# Patient Record
Sex: Male | Born: 1970 | Race: Black or African American | Hispanic: No | Marital: Single | Smoking: Current some day smoker
Health system: Southern US, Community
[De-identification: ages and names within clinical notes are randomized; demographics above are authoritative.]

## PROBLEM LIST (undated history)

## (undated) DIAGNOSIS — R55 Syncope and collapse: Secondary | ICD-10-CM

## (undated) HISTORY — PX: OTHER SURGICAL HISTORY: SHX169

---
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
## 2007-02-14 ENCOUNTER — Emergency Department (HOSPITAL_COMMUNITY): Admission: EM | Admit: 2007-02-14 | Discharge: 2007-02-14 | Payer: Self-pay | Admitting: Emergency Medicine

## 2007-12-04 ENCOUNTER — Emergency Department (HOSPITAL_COMMUNITY): Admission: EM | Admit: 2007-12-04 | Discharge: 2007-12-04 | Payer: Self-pay | Admitting: Emergency Medicine

## 2010-01-15 ENCOUNTER — Emergency Department (HOSPITAL_COMMUNITY): Admission: EM | Admit: 2010-01-15 | Discharge: 2010-01-15 | Payer: Self-pay | Admitting: Emergency Medicine

## 2010-03-29 ENCOUNTER — Emergency Department (HOSPITAL_COMMUNITY): Admission: EM | Admit: 2010-03-29 | Discharge: 2010-03-29 | Payer: Self-pay | Admitting: Emergency Medicine

## 2010-05-27 ENCOUNTER — Emergency Department (HOSPITAL_COMMUNITY): Admission: EM | Admit: 2010-05-27 | Discharge: 2010-05-27 | Payer: Self-pay | Admitting: Emergency Medicine

## 2010-06-30 ENCOUNTER — Emergency Department (HOSPITAL_COMMUNITY): Admission: EM | Admit: 2010-06-30 | Discharge: 2010-06-30 | Payer: Self-pay | Admitting: Emergency Medicine

## 2010-07-27 ENCOUNTER — Emergency Department (HOSPITAL_COMMUNITY): Admission: EM | Admit: 2010-07-27 | Discharge: 2010-07-27 | Payer: Self-pay | Admitting: Emergency Medicine

## 2010-12-30 LAB — RAPID URINE DRUG SCREEN, HOSP PERFORMED
Barbiturates: NOT DETECTED
Opiates: NOT DETECTED
Tetrahydrocannabinol: POSITIVE — AB

## 2010-12-30 LAB — POCT I-STAT, CHEM 8
BUN: 7 mg/dL (ref 6–23)
Calcium, Ion: 1.15 mmol/L (ref 1.12–1.32)
Chloride: 101 mEq/L (ref 96–112)

## 2011-03-18 ENCOUNTER — Emergency Department (HOSPITAL_COMMUNITY): Payer: Self-pay

## 2011-03-18 ENCOUNTER — Emergency Department (HOSPITAL_COMMUNITY)
Admission: EM | Admit: 2011-03-18 | Discharge: 2011-03-18 | Disposition: A | Discharge status: Home / Self Care | Payer: Self-pay | Attending: Emergency Medicine | Admitting: Emergency Medicine

## 2011-03-18 DIAGNOSIS — M542 Cervicalgia: Secondary | ICD-10-CM | POA: Insufficient documentation

## 2011-03-18 DIAGNOSIS — M5412 Radiculopathy, cervical region: Secondary | ICD-10-CM | POA: Insufficient documentation

## 2011-03-18 DIAGNOSIS — M25519 Pain in unspecified shoulder: Secondary | ICD-10-CM | POA: Insufficient documentation

## 2011-03-18 DIAGNOSIS — M5030 Other cervical disc degeneration, unspecified cervical region: Secondary | ICD-10-CM | POA: Insufficient documentation

## 2011-05-08 ENCOUNTER — Emergency Department (HOSPITAL_COMMUNITY)
Admission: EM | Admit: 2011-05-08 | Discharge: 2011-05-09 | Disposition: A | Discharge status: Home / Self Care | Payer: Self-pay | Attending: Emergency Medicine | Admitting: Emergency Medicine

## 2011-05-08 DIAGNOSIS — K089 Disorder of teeth and supporting structures, unspecified: Secondary | ICD-10-CM | POA: Insufficient documentation

## 2011-05-08 DIAGNOSIS — K006 Disturbances in tooth eruption: Secondary | ICD-10-CM | POA: Insufficient documentation

## 2012-04-20 ENCOUNTER — Emergency Department (HOSPITAL_COMMUNITY)
Admission: EM | Admit: 2012-04-20 | Discharge: 2012-04-20 | Disposition: A | Discharge status: Home / Self Care | Payer: Worker's Compensation | Attending: Emergency Medicine | Admitting: Emergency Medicine

## 2012-04-20 ENCOUNTER — Encounter (HOSPITAL_COMMUNITY): Payer: Self-pay | Admitting: *Deleted

## 2012-04-20 DIAGNOSIS — W868XXA Exposure to other electric current, initial encounter: Secondary | ICD-10-CM | POA: Insufficient documentation

## 2012-04-20 DIAGNOSIS — T754XXA Electrocution, initial encounter: Secondary | ICD-10-CM

## 2012-04-20 DIAGNOSIS — R209 Unspecified disturbances of skin sensation: Secondary | ICD-10-CM | POA: Insufficient documentation

## 2012-04-20 LAB — CBC WITH DIFFERENTIAL/PLATELET
Basophils Absolute: 0 10*3/uL (ref 0.0–0.1)
Eosinophils Relative: 3 % (ref 0–5)
HCT: 41.1 % (ref 39.0–52.0)
Lymphocytes Relative: 50 % — ABNORMAL HIGH (ref 12–46)
Lymphs Abs: 3.3 10*3/uL (ref 0.7–4.0)
MCV: 96.5 fL (ref 78.0–100.0)
Monocytes Absolute: 0.3 10*3/uL (ref 0.1–1.0)
Monocytes Relative: 5 % (ref 3–12)
RDW: 13.3 % (ref 11.5–15.5)
WBC: 6.6 10*3/uL (ref 4.0–10.5)

## 2012-04-20 LAB — CK TOTAL AND CKMB (NOT AT ARMC)
CK, MB: 1.6 ng/mL (ref 0.3–4.0)
Relative Index: 1 (ref 0.0–2.5)
Total CK: 155 U/L (ref 7–232)

## 2012-04-20 LAB — COMPREHENSIVE METABOLIC PANEL
BUN: 14 mg/dL (ref 6–23)
CO2: 21 mEq/L (ref 19–32)
Calcium: 8.9 mg/dL (ref 8.4–10.5)
Creatinine, Ser: 1.14 mg/dL (ref 0.50–1.35)
GFR calc Af Amer: >90 mL/min (ref >90)
GFR calc non Af Amer: 80 mL/min — ABNORMAL LOW (ref >90)
Glucose, Bld: 128 mg/dL — ABNORMAL HIGH (ref 70–99)

## 2012-04-20 NOTE — ED Notes (Signed)
The pt was shocked  Tonight at work when he was shocked when he touched a wire.  He has pain and burning in his rt arm where he touched the hot wire

## 2012-04-20 NOTE — ED Provider Notes (Signed)
History     CSN: [id number]  Arrival date & time 04/20/12  0123   First MD Initiated Contact with Patient 04/20/12 [phone]      Chief Complaint  Patient presents with  . shocked at work     HPI  History provided by the patient. Patient is a 41 year old male with no significant past medical history who presents with complaints of lateral shock right hand. Patient was at work at [employer] and states that he was sliding out a small mini freezer and reached behind the unit in the felt a shock to his right hand traveling up his forearm and into the shoulder area. Shock was brief and patient immediately withdrew his hand. Freezer was plugged into a standard outlet. Patient denies any burns or lesions to the hand and skin. Injury occurred several hours ago. Patient now reports having some return of sensations in hand and arm. He has normal strength and movement. He denied having any heart palpitations or other chest pain symptoms following the injury.    History reviewed. No pertinent past medical history.  History reviewed. No pertinent past surgical history.  No family history on file.  History  Substance Use Topics  . Smoking status: Current Everyday Smoker  . Smokeless tobacco: Not on file  . Alcohol Use: No      Review of Systems  Respiratory: Negative for shortness of breath.   Cardiovascular: Negative for chest pain and palpitations.  Musculoskeletal: Negative for joint swelling.  Skin:       Negative for burns  Neurological: Negative for dizziness, light-headedness and headaches.    Allergies  Review of patient's allergies indicates no known allergies.  Home Medications  No current outpatient prescriptions on file.  BP 137/75  Pulse 66  Temp 98.4 F (36.9 C) (Oral)  Resp 18  SpO2 99%  Physical Exam  Nursing note and vitals reviewed. Constitutional: He is oriented to person, place, and time. He appears well-developed and well-nourished.  HENT:  Head:  Normocephalic.  Cardiovascular: Normal rate and regular rhythm.   Pulmonary/Chest: Effort normal and breath sounds normal.  Musculoskeletal:       Normal sensation in right fingers, normal grip strength, normal cap refill less than 2 seconds full range of motion. Skin appears normal  Neurological: He is alert and oriented to person, place, and time.  Skin: Skin is warm.  Psychiatric: He has a normal mood and affect. His behavior is normal.    ED Course  Procedures   Results for orders placed during the hospital encounter of 04/20/12  CBC WITH DIFFERENTIAL      Component Value Range   WBC 6.6  4.0 - 10.5 K/uL   RBC 4.26  4.22 - 5.81 MIL/uL   Hemoglobin 14.3  13.0 - 17.0 g/dL   HCT 11.9  14.7 - 82.9 %   MCV 96.5  78.0 - 100.0 fL   MCH 33.6  26.0 - 34.0 pg   MCHC 34.8  30.0 - 36.0 g/dL   RDW 56.2  13.0 - 86.5 %   Platelets 140 (*) 150 - 400 K/uL   Neutrophils Relative 41 (*) 43 - 77 %   Neutro Abs 2.7  1.7 - 7.7 K/uL   Lymphocytes Relative 50 (*) 12 - 46 %   Lymphs Abs 3.3  0.7 - 4.0 K/uL   Monocytes Relative 5  3 - 12 %   Monocytes Absolute 0.3  0.1 - 1.0 K/uL   Eosinophils Relative 3  0 - 5 %   Eosinophils Absolute 0.2  0.0 - 0.7 K/uL   Basophils Relative 1  0 - 1 %   Basophils Absolute 0.0  0.0 - 0.1 K/uL  COMPREHENSIVE METABOLIC PANEL      Component Value Range   Sodium 135  135 - 145 mEq/L   Potassium 3.5  3.5 - 5.1 mEq/L   Chloride 101  96 - 112 mEq/L   CO2 21  19 - 32 mEq/L   Glucose, Bld 128 (*) 70 - 99 mg/dL   BUN 14  6 - 23 mg/dL   Creatinine, Ser 7.82  0.50 - 1.35 mg/dL   Calcium 8.9  8.4 - 95.6 mg/dL   Total Protein 7.0  6.0 - 8.3 g/dL   Albumin 3.9  3.5 - 5.2 g/dL   AST 16  0 - 37 U/L   ALT 7  0 - 53 U/L   Alkaline Phosphatase 35 (*) 39 - 117 U/L   Total Bilirubin 0.3  0.3 - 1.2 mg/dL   GFR calc non Af Amer 80 (*) >90 mL/min   GFR calc Af Amer >90  >90 mL/min  CK TOTAL AND CKMB      Component Value Range   Total CK 155  7 - 232 U/L   CK, MB 1.6  0.3 -  4.0 ng/mL   Relative Index 1.0  0.0 - 2.5  TROPONIN I      Component Value Range   Troponin I <0.30  <0.30 ng/mL       1. Electrical shock of hand       MDM  4:50 AM patient seen and evaluated. Patient no acute distress. Patient with unremarkable EKG and lab tests. No external signs for burns or other injury. Patient feels arm and are returning to normal.      Date: 04/20/2012  Rate: 67  Rhythm: normal sinus rhythm  QRS Axis: normal  Intervals: normal  ST/T Wave abnormalities: normal  Conduction Disutrbances:none  Narrative Interpretation:   Old EKG Reviewed: none available          Angus Seller, [place] 04/20/12 [phone]

## 2012-04-20 NOTE — ED Provider Notes (Signed)
Medical screening examination/treatment/procedure(s) were performed by non-physician practitioner and as supervising physician I was immediately available for consultation/collaboration.   Gwyneth Sprout, MD 04/20/12 [phone]

## 2012-12-21 ENCOUNTER — Emergency Department (HOSPITAL_COMMUNITY)
Admission: EM | Admit: 2012-12-21 | Discharge: 2012-12-21 | Disposition: A | Discharge status: Home / Self Care | Payer: Self-pay | Attending: Emergency Medicine | Admitting: Emergency Medicine

## 2012-12-21 ENCOUNTER — Encounter (HOSPITAL_COMMUNITY): Payer: Self-pay | Admitting: Adult Health

## 2012-12-21 DIAGNOSIS — F17200 Nicotine dependence, unspecified, uncomplicated: Secondary | ICD-10-CM | POA: Insufficient documentation

## 2012-12-21 DIAGNOSIS — K089 Disorder of teeth and supporting structures, unspecified: Secondary | ICD-10-CM | POA: Insufficient documentation

## 2012-12-21 DIAGNOSIS — K0889 Other specified disorders of teeth and supporting structures: Secondary | ICD-10-CM

## 2012-12-21 MED ORDER — ACETAMINOPHEN-CODEINE #3 300-30 MG PO TABS: 1.0000 | ORAL_TABLET | Freq: Four times a day (QID) | ORAL | Status: DC | PRN

## 2012-12-21 NOTE — ED Provider Notes (Signed)
History    This chart was scribed for non-physician practitioner working with [name], MD by Donne Anon, ED Scribe. This patient was seen in room TR09C/TR09C.   CSN: [id number]  Arrival date & time 12/21/12  2216   None     Chief Complaint  Patient presents with  . Dental Pain     The history is provided by the patient. No language interpreter was used.   Justin Rangel is a 42 y.o. male who presents to the Emergency Department complaining of gradual onset, constant, gradually worsening right sided tooth pain described as sharp and stabbing that radiates to his eyes and head and began yesterday while he was working on his car.He reports that the pain is worse when it is exposed to cold temperatures. He denies fever, chills or any other pain. He is scheduled to see a dentist next week. He has tried Advil with little relief.  Pt is a current everyday smoker but denies alcohol use.  History reviewed. No pertinent past medical history.  History reviewed. No pertinent past surgical history.  History reviewed. No pertinent family history.  History  Substance Use Topics  . Smoking status: Current Every Day Smoker  . Smokeless tobacco: Not on file  . Alcohol Use: No      Review of Systems  HENT: Positive for dental problem.   All other systems reviewed and are negative.    Allergies  Review of patient's allergies indicates no known allergies.  Home Medications  No current outpatient prescriptions on file.  Triage Vitals; BP 130/92  Pulse 82  Temp(Src) 97.6 F (36.4 C) (Oral)  Resp 20  SpO2 97%  Physical Exam  Nursing note and vitals reviewed. Constitutional: He is oriented to person, place, and time. He appears well-developed and well-nourished. No distress.  HENT:  Head: Normocephalic and atraumatic.  Dental carries. Poor dentition. Tenderness on right lower 2nd molar. No surrounding erythema, edema or abscess. No facial swelling or tenderness to  palpation.  Eyes: Conjunctivae and EOM are normal.  Neck: Normal range of motion. Neck supple.  Cardiovascular: Normal rate, regular rhythm and normal heart sounds.   Pulmonary/Chest: Effort normal and breath sounds normal.  Abdominal: Soft. Bowel sounds are normal. He exhibits no distension.  Musculoskeletal: Normal range of motion. He exhibits no edema.  Neurological: He is alert and oriented to person, place, and time.  Skin: Skin is warm and dry.  Psychiatric: He has a normal mood and affect. His behavior is normal.    ED Course  Procedures (including critical care time) DIAGNOSTIC STUDIES: Oxygen Saturation is 97% on room air, adequate by my interpretation.    COORDINATION OF CARE: 10:27 PM Discussed treatment plan which includes pain medication with pt at bedside and pt agreed to plan.       Labs Reviewed - No data to display No results found.   1. Pain, dental       MDM   Dental pain without associated with dental infection. No evidence of dental abscess. Patient is afebrile, non toxic appearing and swallowing secretions well. He has an appointment with the dentist "across the street" next week. Rx tylenol #3. Return precautions discussed. Patient states understanding of plan and is agreeable.     I personally performed the services described in this documentation, which was scribed in my presence. The recorded information has been reviewed and is accurate.        Trevor Mace, PA-C 12/21/12 2250

## 2012-12-21 NOTE — ED Notes (Signed)
Presents with right sided tooth pain that radiates to eyes and head. Pt requesting doctors note for work and something for tooth pain.  Pt staes, "I am supposed to go see a dentist about this tooth and pain but I am going to do that after I come back from [employer]" He staes, "the tooth pain is making my head hurt" alert and oriented.

## 2012-12-22 NOTE — ED Provider Notes (Signed)
Medical screening examination/treatment/procedure(s) were performed by non-physician practitioner and as supervising physician I was immediately available for consultation/collaboration.   [name], MD 12/22/12 [phone]

## 2013-01-24 DIAGNOSIS — R55 Syncope and collapse: Secondary | ICD-10-CM

## 2013-01-24 HISTORY — DX: Syncope and collapse: R55

## 2013-01-25 ENCOUNTER — Emergency Department (HOSPITAL_COMMUNITY): Payer: Self-pay

## 2013-01-25 ENCOUNTER — Encounter (HOSPITAL_COMMUNITY): Payer: Self-pay | Admitting: Emergency Medicine

## 2013-01-25 ENCOUNTER — Observation Stay (HOSPITAL_COMMUNITY)
Admission: EM | Admit: 2013-01-25 | Discharge: 2013-01-27 | Disposition: A | Discharge status: Home / Self Care | Payer: 59 | Attending: Internal Medicine | Admitting: Internal Medicine

## 2013-01-25 DIAGNOSIS — R55 Syncope and collapse: Principal | ICD-10-CM

## 2013-01-25 DIAGNOSIS — W19XXXA Unspecified fall, initial encounter: Secondary | ICD-10-CM | POA: Insufficient documentation

## 2013-01-25 DIAGNOSIS — M25519 Pain in unspecified shoulder: Secondary | ICD-10-CM | POA: Insufficient documentation

## 2013-01-25 DIAGNOSIS — Z8241 Family history of sudden cardiac death: Secondary | ICD-10-CM | POA: Insufficient documentation

## 2013-01-25 DIAGNOSIS — R9431 Abnormal electrocardiogram [ECG] [EKG]: Secondary | ICD-10-CM

## 2013-01-25 HISTORY — DX: Syncope and collapse: R55

## 2013-01-25 LAB — LIPID PANEL
HDL: 48 mg/dL (ref >39)
LDL Cholesterol: 108 mg/dL — ABNORMAL HIGH (ref 0–99)
VLDL: 17 mg/dL (ref 0–40)

## 2013-01-25 LAB — RAPID URINE DRUG SCREEN, HOSP PERFORMED
Barbiturates: NOT DETECTED
Benzodiazepines: NOT DETECTED
Cocaine: NOT DETECTED
Tetrahydrocannabinol: POSITIVE — AB

## 2013-01-25 LAB — BASIC METABOLIC PANEL
BUN: 12 mg/dL (ref 6–23)
Creatinine, Ser: 1.12 mg/dL (ref 0.50–1.35)
GFR calc Af Amer: >90 mL/min (ref >90)
GFR calc non Af Amer: 81 mL/min — ABNORMAL LOW (ref >90)
Glucose, Bld: 81 mg/dL (ref 70–99)

## 2013-01-25 LAB — CBC WITH DIFFERENTIAL/PLATELET
Basophils Absolute: 0 10*3/uL (ref 0.0–0.1)
Basophils Relative: 0 % (ref 0–1)
Eosinophils Absolute: 0.1 10*3/uL (ref 0.0–0.7)
MCH: 33.8 pg (ref 26.0–34.0)
MCHC: 35.9 g/dL (ref 30.0–36.0)
Monocytes Absolute: 0.5 10*3/uL (ref 0.1–1.0)
Neutro Abs: 4.3 10*3/uL (ref 1.7–7.7)
Neutrophils Relative %: 54 % (ref 43–77)
RDW: 13.3 % (ref 11.5–15.5)

## 2013-01-25 LAB — TROPONIN I: Troponin I: <0.3 ng/mL (ref <0.30)

## 2013-01-25 LAB — HEMOGLOBIN A1C
Hgb A1c MFr Bld: 5.5 % (ref <5.7)
Mean Plasma Glucose: 111 mg/dL (ref <117)

## 2013-01-25 LAB — HIV ANTIBODY (ROUTINE TESTING W REFLEX): HIV: NONREACTIVE

## 2013-01-25 MED ORDER — SODIUM CHLORIDE 0.9 % IJ SOLN: 3.0000 mL | Freq: Two times a day (BID) | INTRAMUSCULAR | Status: DC

## 2013-01-25 MED ORDER — ENOXAPARIN SODIUM 40 MG/0.4ML ~~LOC~~ SOLN
40.0000 mg | SUBCUTANEOUS | Status: DC
  Administered 2013-01-25 – 2013-01-27 (×3): 40 mg via SUBCUTANEOUS
  Filled 2013-01-25 (×3): qty 0.4

## 2013-01-25 MED ORDER — SODIUM CHLORIDE 0.9 % IV BOLUS (SEPSIS)
1000.0000 mL | Freq: Once | INTRAVENOUS | Status: AC
  Administered 2013-01-25: 1000 mL via INTRAVENOUS

## 2013-01-25 MED ORDER — SODIUM CHLORIDE 0.9 % IJ SOLN
3.0000 mL | Freq: Two times a day (BID) | INTRAMUSCULAR | Status: DC
  Administered 2013-01-25 – 2013-01-27 (×4): 3 mL via INTRAVENOUS

## 2013-01-25 NOTE — H&P (Signed)
Hospital Admission Note Date: 01/25/2013  Patient name: Justin Rangel Medical record number: [id number] Date of birth: 06/08/1971 Age: 42 y.o. Gender: male PCP: Default, Provider, MD  Medical Service: Internal Medicine Teaching Service  Attending physician:  Blanch Media, MD    1st Contact: Denton Ar, MD  Pager: [phone] 2nd Contact: Genella Mech, MD  Pager: [phone] After 5 pm or weekends: 1st Contact:      Pager: [phone] 2nd Contact:      Pager: [phone]  Chief Complaint:  Syncope   History of Present Illness: This is a 42 year old male with no past medical history who presented to the ED her car for one episode of syncope. Patient reports he was arguing with his cold friend last night and the next thing he knows that he is dropped off at the hospital. Prior to the syncope episode he noted some dizziness but denies any chest pain, shortness breath, headache, weakness, jerking movements, vision changes, nausea, vomiting. He denies any use of alcohol or drugs prior to the event. He does not remember what happened between the timing of passing out and arriving at the hospital. During evaluation of patient no one was available to confirm the story. Per ED note when patient presented cough on and a cousin was present who informed that ED physician about is syncopal episode. Denied any jerking movement. Patient reports this has been the first episode ever. He denies any head injury in the past. Patient noted that he traveled on a bus last week to [place]. During the knee he would walk around as much as possible. Denies any leg pain or swelling. Patient mentioned that he had in upper respirations sickness likely 1-2 weeks ago which resolved with without any intervention.  Patient informed me that his cousin age 21 passed away on 02-22-2023 with sudden cardiac arrest while walking in the mall. No other family members are known to have any similar history. He does not perform any sport  activities now or previously in school. He currently works at [employer].  Of note: Patient was evaluated in April 2000 (4 one episode of syncope. He was getting on a bus and passed out. CT head was negative for any acute process. UDS was positive for marijuana. Patient was discharged home. Meds: No current outpatient prescriptions on file.  Allergies: Allergies as of 01/25/2013  . (No Known Allergies)   History reviewed. No pertinent past medical history. History reviewed. No pertinent past surgical history. Family History  Problem Relation Age of Onset  . Hypertension Mother   . Heart attack Cousin 39    Maternal. Sudden cardiac arrest.    History   Social History  . Marital Status: Single    Spouse Name: N/A    Number of Children: N/A  . Years of Education: N/A   Occupational History  . Not on file.   Social History Main Topics  . Smoking status: Current Every Day Smoker -- 1.00 packs/day for 10 years    Types: Cigarettes  . Smokeless tobacco: Not on file  . Alcohol Use: Yes     Comment: Occasioanl   . Drug Use: No  . Sexually Active: Not on file   Other Topics Concern  . Not on file   Social History Narrative   Works at [employer]. Completed 11th grade.    Review of Systems:  Constitutional: Denies fever, chills, diaphoresis, appetite change and fatigue.  HEENT: Denies photophobia,  hearing loss, ear pain, trouble swallowing, neck pain  Respiratory: Denies SOB, DOE, cough, chest tightness,  and wheezing.   Cardiovascular: Denies chest pain, palpitations and leg swelling.  Gastrointestinal: Denies nausea, vomiting, abdominal pain, diarrhea, constipation, blood in stool and abdominal distention.  Genitourinary: Denies dysuria, urgency, frequency, hematuria, flank pain and difficulty urinating.  Musculoskeletal: Denies myalgias, Skin: Denies pallor, rash and wound.  Neurological: Denies dizziness, seizures, weakness, light-headedness, numbness but mild  headaches.  Hematological: Denies adenopathy  Physical Exam: Blood pressure 126/84, pulse 58, temperature 98.5 F (36.9 C), temperature source Oral, resp. rate 16, SpO2 97.00%. Constitutional: Vital signs reviewed.  Patient is a well-developed and well-nourished  in no acute distress and cooperative with exam. Alert and oriented x3.  Head: Normocephalic and atraumatic Ear: TM normal bilaterally Mouth: no erythema or exudates, MMM, poor dentition  Eyes: PERRL, EOMI, conjunctivae normal, No scleral icterus.  Neck: Supple, Trachea midline normal ROM,no  carotid bruit present.  Cardiovascular: RRR, S1 normal, S2 normal, no MRG, pulses symmetric and intact bilaterally Pulmonary/Chest: CTAB, no wheezes, rales, or rhonchi Abdominal: Soft. Non-tender, non-distended, bowel sounds are normal, no masses, organomegaly, or guarding present.  GU: no CVA tenderness Musculoskeletal: No joint deformities, erythema, or stiffness, ROM full and no nontender Hematology: no cervical,  adenopathy.  Neurological: A&O x3, Strenght is normal and symmetric bilaterally, cranial nerve II-XII are grossly intact, no focal motor deficit, sensory intact to light touch bilaterally.  Skin: Warm, dry and intact. No rash, cyanosis, or clubbing.  Psychiatric: Normal mood and affect.  Lab results: Basic Metabolic Panel:  Recent Labs  78/29/56 0354  NA 138  K 4.4  CL 102  CO2 27  GLUCOSE 81  BUN 12  CREATININE 1.12  CALCIUM 9.3    CBC:  Recent Labs  01/25/13 0354  WBC 7.9  NEUTROABS 4.3  HGB 16.2  HCT 45.1  MCV 94.2  PLT 170   Cardiac Enzymes:  Recent Labs  01/25/13 0354  TROPONINI <0.30   Urine Drug Screen: Drugs of Abuse     Component Value Date/Time   LABOPIA NONE DETECTED 01/25/2013 0528   COCAINSCRNUR NONE DETECTED 01/25/2013 0528   LABBENZ NONE DETECTED 01/25/2013 0528   AMPHETMU NONE DETECTED 01/25/2013 0528   THCU POSITIVE* 01/25/2013 0528   LABBARB NONE DETECTED 01/25/2013 0528       Imaging results:  Dg Shoulder Left  01/25/2013  *RADIOLOGY REPORT*  Clinical Data: Status post fall; left shoulder pain.  LEFT SHOULDER - 2+ VIEW  Comparison: None.  Findings: There is no evidence of fracture or dislocation.  The left humeral head is seated within the glenoid fossa.  The acromioclavicular joint is unremarkable in appearance.  No significant soft tissue abnormalities are seen.  The visualized portions of the left lung are clear.  IMPRESSION: No evidence of fracture or dislocation.   Original Report Authenticated By: Tonia Ghent, M.D.     Other results: EKG:  01/25/13 at 4:06 am: Sinus rhythm Rate of 55, QTc 412 Elevation in leads II, V3, V4, V6  01/25/13 at 6:05 am : Sinus Rhythm, Rate of 70, QTc 414 , ST elevation V2, V3,V4, V5   Assessment & Plan by Problem: Active Problems:   Syncope and collapse  # Syncope an collapse- unclear etiology. Differential diagnosis include vasovagal, cardiogenic including arrhythmias, hypertrophic cardiomyopathy ( Cousin with sudden cardiac arrest), pericarditis/myocarditis ( ST elevation in multiple leads but not diffuse and no chest pain or friction rub present), PE  ( Modified geneva score 0)recreational drugs ( UDS positive for marijuana) seizure (  per ED note no history of jerking movements),  orthostatic hypotension. - Will admit to tele monitor - Will cycle cardiac enzymes -Will obtain 2-D echo to evaluate for any structural abnormalities  - Will evaluate for any risk factors including hemoglobin A1c, lipid panel and HIV -Will obtain orthostatic blood pressure - Will obtain D-dimer - Consider to consult Cardiology   # Tobacco abuse: Constant in smoking cessation  Dispo: Disposition is deferred at this time, awaiting improvement of current medical problems.   The patient does not have a current PCP (Default, Provider, MD), therefore will be requiring OPC follow-up after discharge.   The patient does not have transportation  limitations that hinder transportation to clinic appointments.  SignedAlmyra Deforest 01/25/2013, 8:48 AM

## 2013-01-25 NOTE — ED Notes (Signed)
PT. REPORTS " PASSED OUT" AND FELL AFTER A VERBAL ARGUMENT WITH GIRLFRIEND THIS EVENING , ALERT AND ORIENTED /AMBULATORY, REPORTS LEFT SHOULDER PAIN .
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
## 2013-01-25 NOTE — ED Notes (Signed)
Pt denies pain.  No distress noted.  Resp symmetrical and unlabored.  Skin warm and dry.

## 2013-01-25 NOTE — ED Notes (Signed)
Patient transported to X-ray 
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
## 2013-01-26 ENCOUNTER — Encounter (HOSPITAL_COMMUNITY)
Admission: EM | Disposition: A | Discharge status: Home / Self Care | Payer: Self-pay | Source: Home / Self Care | Attending: Emergency Medicine

## 2013-01-26 HISTORY — PX: TILT TABLE STUDY: SHX5493

## 2013-01-26 SURGERY — TILT TABLE STUDY

## 2013-01-26 MED ORDER — EPINEPHRINE HCL 1 MG/ML IJ SOLN
0.0500 ug/min | INTRAVENOUS | Status: DC
  Administered 2013-01-26: 0.05 ug/min via INTRAVENOUS
  Filled 2013-01-26 (×2): qty 1

## 2013-01-26 MED ORDER — FLECAINIDE ACETATE 50 MG PO TABS
150.0000 mg | ORAL_TABLET | Freq: Once | ORAL | Status: AC
  Administered 2013-01-26: 150 mg via ORAL
  Filled 2013-01-26: qty 1
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
## 2013-01-26 NOTE — Op Note (Addendum)
EP procedure Note   Pre procedure Diagnosis:  Unexplained syncope Post procedure Diagnosis:  Same  Procedures:  Epinephrine infusion  Description:  Informed, written consent was obtained for cardioversion.  Adequate IV acces and airway support were assured.  The patient required no sedation for the procedure today.  The patient presented today in sinus rhythm with baselin Qt of 400 msec.  Epinephrine was infused in 5 minute intervals per standard protochol with continuous ekg monitoring. Results as follows.  Baseline:   QT 400 msec, Qtc 386 msec Epi 0.05 mcg/kg/min x 5 minutes:  Qt , Qtc 373 msec Epi 0.10 mcg/kg/min x 5 minutes:  Qt 376 msec, Qtc 378 msec Epi 0.20 mcg/kg/min x 5 minutes: Qt 332 msec, Qtc 336 msec with prominent u waves observed EKG 5 minutes post infusion: Qt 400 msec, Qtc 386 msec  No arrhythmias during infusion though the patient did develop mild chest tightness which resolved.  There were no early apparent complications.  Procedure time 30 minutes.  Conclusions:  1.  Normal epinephrine challenge 2.  No early apparent complications.   Romel Dumond,MD 5:55 PM 01/26/2013

## 2013-01-27 ENCOUNTER — Other Ambulatory Visit: Payer: Self-pay | Admitting: *Deleted

## 2013-01-27 DIAGNOSIS — R55 Syncope and collapse: Secondary | ICD-10-CM

## 2013-01-27 NOTE — Progress Notes (Signed)
EKGs obtained per orders after administration of flecainide.  Justin Simpson PA notified of ST changes after first two EKGs.  Pt. Denies CP, SOB, or palpitations.  VSS.  Will continue to monitor patient.

## 2013-01-27 NOTE — Progress Notes (Signed)
PROGRESS NOTE  Subjective:   Justin Rangel is a 42 yo with hx of recurrent syncope.   He had a negative epinephrine challenge and negative Flecainide challenge yesterday.  He is scheduled ( ?) for treadmill this am.   No further episodes of syncope.  Objective:    Vital Signs:   Temp:  [97.7 F (36.5 C)-98.2 F (36.8 C)] 97.7 F (36.5 C) (04/19 0600) Pulse Rate:  [51-57] 52 (04/19 0600) Resp:  [18] 18 (04/19 0600) BP: (108-124)/(72-80) 108/72 mmHg (04/19 0600) SpO2:  [98 %-99 %] 98 % (04/19 0600)  Last BM Date: 01/26/13   24-hour weight change: Weight change:   Weight trends: Filed Weights   01/25/13 1014 01/26/13 0406  Weight: 150 lb (68.04 kg) 139 lb 11.2 oz (63.368 kg)    Intake/Output:  04/18 0701 - 04/19 0700 In: 720 [P.O.:720] Out: 3 [Urine:3]     Physical Exam: BP 108/72  Pulse 52  Temp(Src) 97.7 F (36.5 C) (Oral)  Resp 18  Ht 6\' 1"  (1.854 m)  Wt 139 lb 11.2 oz (63.368 kg)  BMI 18.44 kg/m2  SpO2 98%  General: Vital signs reviewed and noted. Thin, young black male  Head: Normocephalic, atraumatic.  Eyes: conjunctivae/corneas clear.  EOM's intact.   Throat: normal  Neck:  normal  Lungs:    clear  Heart:  RR , normal S1, S2  Abdomen:  Soft, non-tender, non-distended  thin  Extremities: No edema   Neurologic: A&O X3, CN II - XII are grossly intact.   Psych: Normal     Labs: BMET:  Recent Labs  01/25/13 0354  NA 138  K 4.4  CL 102  CO2 27  GLUCOSE 81  BUN 12  CREATININE 1.12  CALCIUM 9.3    Liver function tests: No results found for this basename: AST, ALT, ALKPHOS, BILITOT, PROT, ALBUMIN,  in the last 72 hours No results found for this basename: LIPASE, AMYLASE,  in the last 72 hours  CBC:  Recent Labs  01/25/13 0354  WBC 7.9  NEUTROABS 4.3  HGB 16.2  HCT 45.1  MCV 94.2  PLT 170    Cardiac Enzymes:  Recent Labs  01/25/13 0354 01/25/13 0831 01/25/13 1250 01/25/13 2112  TROPONINI <0.30 <0.30 <0.30 <0.30    Coagulation  Studies: No results found for this basename: LABPROT, INR,  in the last 72 hours  Other: No components found with this basename: POCBNP,   Recent Labs  01/25/13 1245  DDIMER <0.27    Recent Labs  01/25/13 0828  HGBA1C 5.5    Recent Labs  01/25/13 0834  CHOL 173  HDL 48  LDLCALC 108*  TRIG 87  CHOLHDL 3.6    Recent Labs  01/25/13 0828  TSH 0.750   No results found for this basename: VITAMINB12, FOLATE, FERRITIN, TIBC, IRON, RETICCTPCT,  in the last 72 hours   Other results:  Tele:  NSR    Medications:    Infusions:    Scheduled Medications: . enoxaparin (LOVENOX) injection  40 mg Subcutaneous Q24H  . sodium chloride  3 mL Intravenous Q12H  . sodium chloride  3 mL Intravenous Q12H    Assessment/ Plan:   Active Problems:   Syncope and collapse  See Dr. Jenel Lucks consult note.  The epinephrine challenge and flecainide challenge were reportedly normal.      Disposition: DC today if GXT is normal.  He needs to call our office and ask for Heather or  [employer].  [phone].  He will need to be scheduled for Implantable Loop recorder by Dr. Graciela Husbands next week.   Length of Stay: 2  Vesta Mixer, Montez Hageman., MD, [facility] 01/27/2013, 9:19 AM Office [phone] Pager [phone]

## 2013-01-27 NOTE — Progress Notes (Signed)
EKGs reviewed post flecainide 150mg x 1.  He did not have brugada ekg pattern develop with flecainide administration.  This is a negative study. 

## 2013-01-27 NOTE — Progress Notes (Signed)
Exercise Treadmill Test (see report for further details)  Pre: no chest pain or sob.  VSS.  ECG with diffuse ST elevation.  During: Ex time - 11:54, Max HR 150 (target 153), ECG w/o acute st/t changes, VSS.  He developed lightheadedness without arrhythmia or hypotension and thus test was ended.  Post:  Lightheadedness resolved.  VSS.  ECG w/o acute st/t changes.  Rec:  D/c home today.  No driving until directed otherwise.  He should return Monday 4/21 @ 10 AM to Short Stay for implantable loop recorder with Dr. Graciela Husbands.

## 2013-01-29 ENCOUNTER — Encounter (HOSPITAL_COMMUNITY)
Admission: RE | Disposition: A | Discharge status: Home / Self Care | Payer: Self-pay | Source: Ambulatory Visit | Attending: Internal Medicine

## 2013-01-29 ENCOUNTER — Ambulatory Visit (HOSPITAL_COMMUNITY)
Admission: RE | Admit: 2013-01-29 | Discharge: 2013-01-29 | Disposition: A | Discharge status: Home / Self Care | Payer: Self-pay | Source: Ambulatory Visit | Attending: Internal Medicine | Admitting: Internal Medicine

## 2013-01-29 ENCOUNTER — Encounter: Payer: Self-pay | Admitting: Emergency Medicine

## 2013-01-29 DIAGNOSIS — R55 Syncope and collapse: Secondary | ICD-10-CM

## 2013-01-29 HISTORY — PX: LOOP RECORDER IMPLANT: SHX5477

## 2013-01-29 LAB — SURGICAL PCR SCREEN: Staphylococcus aureus: NEGATIVE

## 2013-01-29 SURGERY — LOOP RECORDER IMPLANT
Anesthesia: LOCAL

## 2013-01-29 MED ORDER — SODIUM CHLORIDE 0.9 % IR SOLN: 80.0000 mg | Status: DC

## 2013-01-29 MED ORDER — MUPIROCIN 2 % EX OINT
TOPICAL_OINTMENT | CUTANEOUS | Status: AC
  Administered 2013-01-29: 1 via NASAL
  Filled 2013-01-29: qty 22

## 2013-01-29 MED ORDER — LIDOCAINE HCL (PF) 1 % IJ SOLN
INTRAMUSCULAR | Status: AC
  Filled 2013-01-29: qty 30

## 2013-01-29 MED ORDER — MUPIROCIN 2 % EX OINT: TOPICAL_OINTMENT | Freq: Once | CUTANEOUS | Status: AC

## 2013-01-29 MED ORDER — CEFAZOLIN SODIUM-DEXTROSE 2-3 GM-% IV SOLR: 2.0000 g | INTRAVENOUS | Status: DC

## 2013-01-29 MED ORDER — CHLORHEXIDINE GLUCONATE 4 % EX LIQD: 60.0000 mL | Freq: Once | CUTANEOUS | Status: DC

## 2013-01-29 NOTE — CV Procedure (Signed)
Preop Dx: syncope Postop Dx: same/     LOOP RECORDER IMPLANT   After routine prep and drape of the left upper chest, lidocaine was infiltrated lateral to the sternum and caudal to the medial aspect of the clavicle. An incision was madewith insertion a Reveal LINQ Medtronic loop recorder serial number [id number].

## 2013-01-29 NOTE — Progress Notes (Signed)
CALLED CAROL,RN IN CATH LAB AND NOTIFIED HER CLIENT WITH STERISTRIPS AND TEGADERM OVER STERISTRIPS AND SHE NOTIFIED DR Graciela Husbands AND CAME UP TO CHANGE DRESSING PER DR Graciela Husbands AND WHEN WE WENT IN CLIENT'S ROOM; HE WAS GONE.

## 2013-01-29 NOTE — Interval H&P Note (Signed)
History and Physical Interval Note:  01/29/2013 12:56 PM  Justin Rangel  has presented today for surgery, with the diagnosis of syncope  The various methods of treatment have been discussed with the patient and family. After consideration of risks, benefits and other options for treatment, the patient has consented to  Procedure(s): LOOP RECORDER IMPLANT (N/A) as a surgical intervention .  The patient's history has been reviewed, patient examined, no change in status, stable for surgery.  I have reviewed the patient's chart and labs.  Questions were answered to the patient's satisfaction.     Sherryl Manges

## 2013-01-29 NOTE — H&P (View-Only) (Signed)
EKGs reviewed post flecainide 150mg  x 1.  He did not have brugada ekg pattern develop with flecainide administration.  This is a negative study.

## 2013-01-31 ENCOUNTER — Telehealth: Payer: Self-pay | Admitting: Internal Medicine

## 2013-01-31 ENCOUNTER — Telehealth: Payer: Self-pay | Admitting: *Deleted

## 2013-01-31 NOTE — Telephone Encounter (Signed)
Walk In Pt Form " When can Pt RTW" Sent to Message Nurse  01/31/13/KM

## 2013-01-31 NOTE — Telephone Encounter (Signed)
Pt walked in requesting an excuse note for being in the hospital and needs to know when he can return to work.  Pt may return to work Monday February 05, 2013.  A note will be provided.  Left message for pt to see if he wants to pick it up or if it needs to be faxed somewhere.

## 2013-01-31 NOTE — Telephone Encounter (Signed)
Per pt - he is continuing to feel faint - he wants to know if he should go back to work because of this or if he should wait until he has been treated for syncope.  He has not had syncope again at this point.  Aware I will forward information to Dr Graciela Husbands for recommendations.

## 2013-02-01 NOTE — Telephone Encounter (Signed)
Discussed pt concerns with dr Graciela Husbands, pt scheduled to have bp check tomorrow. Per dr Graciela Husbands, if bp okay no cardiac reason for pt to stay out of work. Pt voiced understanding

## 2013-02-02 ENCOUNTER — Ambulatory Visit (INDEPENDENT_AMBULATORY_CARE_PROVIDER_SITE_OTHER): Payer: Self-pay | Admitting: *Deleted

## 2013-02-02 ENCOUNTER — Encounter: Payer: Self-pay | Admitting: *Deleted

## 2013-02-02 VITALS — BP 118/86 | HR 72 | Wt 146.8 lb

## 2013-02-02 DIAGNOSIS — R42 Dizziness and giddiness: Secondary | ICD-10-CM

## 2013-02-02 NOTE — Progress Notes (Signed)
Patient came in for a blood pressure check. Patient stated that he went back to work and he was unable to do his job and was let go. When he was trying to do his work he would start feeling like he was going to "pass out" and having blurred vision.  Patient wanting a note from Dr Graciela Husbands that he was unable to work secondary to medical problems. Blood pressure within normal limits and based on previous phone note per Dr. Graciela Husbands patient stable from cardiac standpoint if blood pressure within normal limits. No follow up appointment had been made with [facility] as noted in d/c from hospital. Did call and get patient an appointment with the office for next Thursday May 1, advised patient. Patient will follow up with wound check next week with device clinic.

## 2013-02-07 ENCOUNTER — Encounter: Payer: Self-pay | Admitting: Internal Medicine

## 2013-02-07 ENCOUNTER — Ambulatory Visit (INDEPENDENT_AMBULATORY_CARE_PROVIDER_SITE_OTHER): Payer: Self-pay | Admitting: *Deleted

## 2013-02-07 ENCOUNTER — Other Ambulatory Visit: Payer: Self-pay | Admitting: Internal Medicine

## 2013-02-07 DIAGNOSIS — R55 Syncope and collapse: Secondary | ICD-10-CM

## 2013-02-07 LAB — PACEMAKER DEVICE OBSERVATION

## 2013-02-07 NOTE — Progress Notes (Signed)
Wound check-ILR 

## 2013-02-08 ENCOUNTER — Ambulatory Visit: Payer: Self-pay | Admitting: Internal Medicine

## 2013-03-06 ENCOUNTER — Ambulatory Visit (INDEPENDENT_AMBULATORY_CARE_PROVIDER_SITE_OTHER): Payer: Self-pay | Admitting: *Deleted

## 2013-03-06 DIAGNOSIS — R55 Syncope and collapse: Secondary | ICD-10-CM

## 2013-04-12 ENCOUNTER — Encounter: Payer: Self-pay | Admitting: Internal Medicine

## 2013-05-10 ENCOUNTER — Encounter: Payer: Self-pay | Admitting: Internal Medicine

## 2013-05-11 ENCOUNTER — Encounter: Payer: Self-pay | Admitting: Internal Medicine

## 2013-08-10 ENCOUNTER — Telehealth: Payer: Self-pay | Admitting: *Deleted

## 2013-08-10 NOTE — Telephone Encounter (Signed)
Spoke w/pt in regards to [facility] not receiving information. Pt was instructed to send manual transmission. Pt was also scheduled for 09-05-13 @ 900 for overdue OV with JA.

## 2013-08-23 ENCOUNTER — Encounter: Payer: Self-pay | Admitting: Internal Medicine

## 2013-09-05 ENCOUNTER — Ambulatory Visit (INDEPENDENT_AMBULATORY_CARE_PROVIDER_SITE_OTHER): Payer: Self-pay | Admitting: Internal Medicine

## 2013-09-05 ENCOUNTER — Encounter: Payer: Self-pay | Admitting: Internal Medicine

## 2013-09-05 VITALS — BP 132/96 | HR 64 | Ht 73.0 in | Wt 162.0 lb

## 2013-09-05 DIAGNOSIS — R55 Syncope and collapse: Secondary | ICD-10-CM

## 2013-09-05 LAB — MDC_IDC_ENUM_SESS_TYPE_INCLINIC

## 2013-09-05 NOTE — Patient Instructions (Signed)
Your physician wants you to follow-up in: 12 months with Dr Allred You will receive a reminder letter in the mail two months in advance. If you don't receive a letter, please call our office to schedule the follow-up appointment.  

## 2013-09-05 NOTE — Progress Notes (Signed)
   Justin Rangel is a 42 y.o. male who presents today for routine electrophysiology followup.  Since last being seen in our clinic, the patient reports doing very well.  Today, he denies symptoms of palpitations, chest pain, shortness of breath,  lower extremity edema, dizziness, presyncope, or syncope.  The patient is otherwise without complaint today.   Past Medical History  Diagnosis Date  . Syncope 01/24/2013   Past Surgical History  Procedure Laterality Date  . Hernia repair  1982   Medicine- none  Physical Exam: Filed Vitals:   09/05/13 0856  BP: 132/96  Pulse: 64  Height: 6\' 1"  (1.854 m)  Weight: 162 lb (73.483 kg)  SpO2: 98%    GEN- The patient is well appearing, alert and oriented x 3 today.   Head- normocephalic, atraumatic Eyes-  Sclera clear, conjunctiva pink Ears- hearing intact Oropharynx- clear Lungs- Clear to ausculation bilaterally, normal work of breathing Heart- Regular rate and rhythm, no murmurs, rubs or gallops, PMI not laterally displaced GI- soft, NT, ND, + BS Extremities- no clubbing, cyanosis, or edema  ILR interrogation today is reviewed and reveals no arrhythmias  Assessment and Plan:  1. Syncope No recurrence He says that his cousin did not have an autopsy because her brother refused Continue to monitor his ILR He will contact us if he has syncope  2. Tobacco Cessation advised  Return in 1 year

## 2013-09-30 ENCOUNTER — Encounter (HOSPITAL_COMMUNITY): Payer: Self-pay | Admitting: Emergency Medicine

## 2013-09-30 ENCOUNTER — Emergency Department (HOSPITAL_COMMUNITY)
Admission: EM | Admit: 2013-09-30 | Discharge: 2013-09-30 | Disposition: A | Discharge status: Home / Self Care | Payer: Self-pay | Attending: Emergency Medicine | Admitting: Emergency Medicine

## 2013-09-30 DIAGNOSIS — K0889 Other specified disorders of teeth and supporting structures: Secondary | ICD-10-CM

## 2013-09-30 DIAGNOSIS — K029 Dental caries, unspecified: Secondary | ICD-10-CM | POA: Insufficient documentation

## 2013-09-30 DIAGNOSIS — F17200 Nicotine dependence, unspecified, uncomplicated: Secondary | ICD-10-CM | POA: Insufficient documentation

## 2013-09-30 DIAGNOSIS — K089 Disorder of teeth and supporting structures, unspecified: Secondary | ICD-10-CM | POA: Insufficient documentation

## 2013-09-30 DIAGNOSIS — Z8669 Personal history of other diseases of the nervous system and sense organs: Secondary | ICD-10-CM | POA: Insufficient documentation

## 2013-09-30 MED ORDER — AMOXICILLIN 500 MG PO CAPS
500.0000 mg | ORAL_CAPSULE | Freq: Once | ORAL | Status: AC
  Administered 2013-09-30: 500 mg via ORAL
  Filled 2013-09-30: qty 1

## 2013-09-30 MED ORDER — TRAMADOL HCL 50 MG PO TABS: 50.0000 mg | ORAL_TABLET | Freq: Four times a day (QID) | ORAL | Status: DC | PRN

## 2013-09-30 MED ORDER — TRAMADOL HCL 50 MG PO TABS
50.0000 mg | ORAL_TABLET | Freq: Once | ORAL | Status: AC
  Administered 2013-09-30: 50 mg via ORAL
  Filled 2013-09-30: qty 1

## 2013-09-30 MED ORDER — AMOXICILLIN 500 MG PO CAPS: 500.0000 mg | ORAL_CAPSULE | Freq: Three times a day (TID) | ORAL | Status: DC

## 2013-09-30 NOTE — ED Provider Notes (Signed)
CSN: [id number]     Arrival date & time 09/30/13  6213 History   First MD Initiated Contact with Patient 09/30/13 0645     Chief Complaint  Patient presents with  . Dental Pain   (Consider location/radiation/quality/duration/timing/severity/associated sxs/prior Treatment) HPI Comments: Patient here with left lower jaw dental pain - he states that he is saving his money up to go see the dentist and that he knows that he needs a lot of dental work.  Patient denies fever, chills, facial swelling, nausea, vomiting.  Patient is a 42 y.o. male presenting with tooth pain. The history is provided by the patient. No language interpreter was used.  Dental Pain Location:  Lower Lower teeth location:  18/LL 2nd molar, 19/LL 1st molar and 20/LL 2nd bicuspid Quality:  Pulsating Severity:  Moderate Onset quality:  Gradual Duration:  2 days Timing:  Constant Progression:  Worsening Chronicity:  New Context: dental caries and poor dentition   Context: not abscess, not crown fracture, not dental fracture, filling still in place and not trauma   Relieved by:  Nothing Worsened by:  Nothing tried Ineffective treatments:  None tried Associated symptoms: gum swelling   Associated symptoms: no difficulty swallowing, no drooling, no facial pain, no facial swelling, no oral bleeding, no oral lesions and no trismus   Risk factors: lack of dental care and periodontal disease     Past Medical History  Diagnosis Date  . Syncope 01/24/2013   Past Surgical History  Procedure Laterality Date  . Hernia repair  1982  . Pace maker     Family History  Problem Relation Age of Onset  . Hypertension Mother   . Other Mother     Diabetes  . Heart attack Cousin 39    Maternal. Sudden cardiac arrest.   . Other Father     Dialysis  . Hypertension Sister   . Other Sister     Diabetes   History  Substance Use Topics  . Smoking status: Current Some Day Smoker -- 1.00 packs/day for 10 years    Types:  Cigarettes  . Smokeless tobacco: Never Used     Comment: he tells me that he has quit but smells of tobacco today  . Alcohol Use: Yes     Comment: Occasioanl     Review of Systems  HENT: Negative for drooling, facial swelling and mouth sores.   All other systems reviewed and are negative.    Allergies  Review of patient's allergies indicates no known allergies.  Home Medications  No current outpatient prescriptions on file. BP 177/102  Pulse 55  Temp(Src) 97.9 F (36.6 C) (Oral)  Resp 18  SpO2 100% Physical Exam  Nursing note and vitals reviewed. Constitutional: He is oriented to person, place, and time. He appears well-developed and well-nourished. No distress.  HENT:  Head: Normocephalic and atraumatic.  Right Ear: External ear normal.  Left Ear: External ear normal.  Nose: Nose normal.  Mouth/Throat: Oropharynx is clear and moist. No oropharyngeal exudate.  Left lower dentition teeth 18, 19 and 20 with dental caries, no abscess noted  Eyes: Conjunctivae are normal. No scleral icterus.  Pulmonary/Chest: Effort normal.  Musculoskeletal: Normal range of motion. He exhibits no edema and no tenderness.  Neurological: He is alert and oriented to person, place, and time. He exhibits normal muscle tone. Coordination normal.  Skin: Skin is warm and dry. No rash noted. No erythema. No pallor.  Psychiatric: He has a normal mood and affect. His behavior  is normal. Judgment and thought content normal.    ED Course  Procedures (including critical care time) Labs Review Labs Reviewed - No data to display Imaging Review No results found.  EKG Interpretation   None       MDM  Dental pain Patient here with recurrent dental pain, so concern for ludwigs angina, PTA, peri-apical or facial abscess.    Izola Price Marisue Humble, [place] 09/30/13 [phone]

## 2013-09-30 NOTE — ED Notes (Signed)
Pt reports left sided jaw pain x 2 days. Pt has taken ibuprofen and oral analgesic gel with no relief.

## 2013-09-30 NOTE — ED Notes (Signed)
Patient discharge to home with family. NAD.  

## 2013-10-03 NOTE — ED Provider Notes (Signed)
Medical screening examination/treatment/procedure(s) were performed by non-physician practitioner and as supervising physician I was immediately available for consultation/collaboration.  EKG Interpretation   None        Grover Woodfield R. Aftin Lye, MD 10/03/13 0021 

## 2013-11-08 ENCOUNTER — Telehealth: Payer: Self-pay | Admitting: *Deleted

## 2013-11-08 ENCOUNTER — Encounter (HOSPITAL_COMMUNITY): Payer: Self-pay | Admitting: Emergency Medicine

## 2013-11-08 ENCOUNTER — Emergency Department (HOSPITAL_COMMUNITY)
Admission: EM | Admit: 2013-11-08 | Discharge: 2013-11-08 | Disposition: A | Discharge status: Home / Self Care | Payer: Self-pay | Attending: Emergency Medicine | Admitting: Emergency Medicine

## 2013-11-08 DIAGNOSIS — H9209 Otalgia, unspecified ear: Secondary | ICD-10-CM | POA: Insufficient documentation

## 2013-11-08 DIAGNOSIS — R509 Fever, unspecified: Secondary | ICD-10-CM | POA: Insufficient documentation

## 2013-11-08 DIAGNOSIS — K029 Dental caries, unspecified: Secondary | ICD-10-CM | POA: Insufficient documentation

## 2013-11-08 DIAGNOSIS — K006 Disturbances in tooth eruption: Secondary | ICD-10-CM | POA: Insufficient documentation

## 2013-11-08 DIAGNOSIS — F17200 Nicotine dependence, unspecified, uncomplicated: Secondary | ICD-10-CM | POA: Insufficient documentation

## 2013-11-08 DIAGNOSIS — K089 Disorder of teeth and supporting structures, unspecified: Secondary | ICD-10-CM | POA: Insufficient documentation

## 2013-11-08 DIAGNOSIS — R112 Nausea with vomiting, unspecified: Secondary | ICD-10-CM | POA: Insufficient documentation

## 2013-11-08 DIAGNOSIS — I10 Essential (primary) hypertension: Secondary | ICD-10-CM | POA: Insufficient documentation

## 2013-11-08 MED ORDER — OXYCODONE-ACETAMINOPHEN 5-325 MG PO TABS
2.0000 | ORAL_TABLET | Freq: Once | ORAL | Status: AC
  Administered 2013-11-08: 2 via ORAL
  Filled 2013-11-08: qty 2

## 2013-11-08 MED ORDER — OXYCODONE-ACETAMINOPHEN 5-325 MG PO TABS: 1.0000 | ORAL_TABLET | Freq: Three times a day (TID) | ORAL | Status: DC | PRN

## 2013-11-08 MED ORDER — PENICILLIN V POTASSIUM 500 MG PO TABS: 500.0000 mg | ORAL_TABLET | Freq: Three times a day (TID) | ORAL | Status: DC

## 2013-11-08 NOTE — ED Notes (Signed)
PT ambulated with baseline gait; VSS; A&Ox3; no signs of distress; respirations even and unlabored; skin warm and dry; no questions upon discharge.  

## 2013-11-08 NOTE — ED Notes (Signed)
Pt. reports worsening left lower molar pain for 3 days unrelieved by OTC pain medications .

## 2013-11-08 NOTE — Telephone Encounter (Signed)
Spoke w/Jasmine in regards to disconnected monitor. Monitor disconnected since 09-04-13. Instructed to tell pt to hook up monitor ASAP. Per MattoonJasmine message will be given to pt.

## 2013-11-08 NOTE — Discharge Instructions (Signed)
1. Medications: percocet, penicillin, usual home medications 2. Treatment: rest, drink plenty of fluids, take medications as prescribed 3. Follow Up: Please followup with your primary doctor for discussion of your diagnoses and further evaluation after today's visit; if you do not have a primary care doctor use the resource guide provided to find one; f/u with dentistry as discussed   Dental Pain A tooth ache may be caused by cavities (tooth decay). Cavities expose the nerve of the tooth to air and hot or cold temperatures. It may come from an infection or abscess (also called a boil or furuncle) around your tooth. It is also often caused by dental caries (tooth decay). This causes the pain you are having. DIAGNOSIS  Your caregiver can diagnose this problem by exam. TREATMENT   If caused by an infection, it may be treated with medications which kill germs (antibiotics) and pain medications as prescribed by your caregiver. Take medications as directed.  Only take over-the-counter or prescription medicines for pain, discomfort, or fever as directed by your caregiver.  Whether the tooth ache today is caused by infection or dental disease, you should see your dentist as soon as possible for further care. SEEK MEDICAL CARE IF: The exam and treatment you received today has been provided on an emergency basis only. This is not a substitute for complete medical or dental care. If your problem worsens or new problems (symptoms) appear, and you are unable to meet with your dentist, call or return to this location. SEEK IMMEDIATE MEDICAL CARE IF:   You have a fever.  You develop redness and swelling of your face, jaw, or neck.  You are unable to open your mouth.  You have severe pain uncontrolled by pain medicine. MAKE SURE YOU:   Understand these instructions.  Will watch your condition.  Will get help right away if you are not doing well or get worse. Document Released: 09/27/2005 Document  Revised: 12/20/2011 Document Reviewed: 05/15/2008 [facility] Patient Information 2014 [employer], [place].

## 2013-11-08 NOTE — ED Provider Notes (Signed)
CSN: [id number]     Arrival date & time 11/08/13  2014 History  This chart was scribed for non-physician practitioner Dierdre Forth, PA-C working with Raeford Razor, MD by Dorothey Baseman, ED Scribe. This patient was seen in room TR07C/TR07C and the patient's care was started at 9:54 PM.    Chief Complaint  Patient presents with  . Dental Pain   The history is provided by the patient and medical records. No language interpreter was used.   HPI Comments: Justin Rangel is a 43 y.o. male who presents to the Emergency Department complaining of a constant, throbbing pain to the left, lower dentition that has been progressively worsening over the past 3 days. He states that the pain is exacerbated with chewing, but alleviated with heat and ice. Patient reports noticing some gingival bleeding while brushing his teeth which has resolved. He reports taking ibuprofen and applying Orajel to the area at home without relief. He reports an associated subjective fever (patient is afebrile at 98.3 in the ED), chills, nausea tonight only (now resolved), and one episode of emesis tonight.  Patient was seen here on 09/30/2013 for similar complaints and was discharged with amoxicillin and tramadol with advice to follow up with the referred dentist. Patient states that he has an appointment with a dentist next week, but that he came to the ED tonight because his pain became too severe.  He denies abdominal pain, diarrhea. Patient has no other pertinent medical history.   Past Medical History  Diagnosis Date  . Syncope 01/24/2013   Past Surgical History  Procedure Laterality Date  . Hernia repair  1982  . Pace maker     Family History  Problem Relation Age of Onset  . Hypertension Mother   . Other Mother     Diabetes  . Heart attack Cousin 39    Maternal. Sudden cardiac arrest.   . Other Father     Dialysis  . Hypertension Sister   . Other Sister     Diabetes   History  Substance Use Topics  .  Smoking status: Current Some Day Smoker -- 1.00 packs/day for 10 years    Types: Cigarettes  . Smokeless tobacco: Never Used  . Alcohol Use: Yes     Comment: Occasioanl     Review of Systems  Constitutional: Positive for fever (subjective) and chills. Negative for appetite change.  HENT: Positive for dental problem and ear pain. Negative for drooling, facial swelling, nosebleeds, postnasal drip, rhinorrhea and trouble swallowing.   Eyes: Negative for pain and redness.  Respiratory: Negative for cough and wheezing.   Cardiovascular: Negative for chest pain.  Gastrointestinal: Positive for nausea and vomiting (x1). Negative for abdominal pain and diarrhea.  Musculoskeletal: Negative for neck pain and neck stiffness.  Skin: Negative for color change and rash.  Neurological: Negative for weakness, light-headedness and headaches.  All other systems reviewed and are negative.    Allergies  Review of patient's allergies indicates no known allergies.  Home Medications   Current Outpatient Rx  Name  Route  Sig  Dispense  Refill  . benzocaine (ORAJEL) 10 % mucosal gel   Mouth/Throat   Use as directed 1 application in the mouth or throat as needed for mouth pain.         Marland Kitchen ibuprofen (ADVIL,MOTRIN) 200 MG tablet   Oral   Take 200 mg by mouth every 6 (six) hours as needed for moderate pain.         Marland Kitchen  oxyCODONE-acetaminophen (PERCOCET/ROXICET) 5-325 MG per tablet   Oral   Take 1-2 tablets by mouth every 8 (eight) hours as needed for severe pain.   15 tablet   0   . penicillin v potassium (VEETID) 500 MG tablet   Oral   Take 1 tablet (500 mg total) by mouth 3 (three) times daily.   30 tablet   0    Triage Vitals: BP 157/106  Pulse 83  Temp(Src) 98.3 F (36.8 C) (Oral)  Resp 18  Ht 6\' 1"  (1.854 m)  Wt 162 lb 6 oz (73.653 kg)  BMI 21.43 kg/m2  SpO2 99%  Physical Exam  Nursing note and vitals reviewed. Constitutional: He appears well-developed and well-nourished.   HENT:  Head: Normocephalic and atraumatic.  Right Ear: Tympanic membrane, external ear and ear canal normal.  Left Ear: Tympanic membrane, external ear and ear canal normal.  Nose: Nose normal. Right sinus exhibits no maxillary sinus tenderness and no frontal sinus tenderness. Left sinus exhibits no maxillary sinus tenderness and no frontal sinus tenderness.  Mouth/Throat: Uvula is midline, oropharynx is clear and moist and mucous membranes are normal. No oral lesions. Abnormal dentition. Dental caries present. No uvula swelling or lacerations. No oropharyngeal exudate, posterior oropharyngeal edema, posterior oropharyngeal erythema or tonsillar abscesses.  Pain to palpation to tooth #18 without gingival erythema or palpable abscess No facial swelling or erythema No induration to the floor of the mouth or the soft tissues of the neck  Eyes: Conjunctivae are normal. Pupils are equal, round, and reactive to light. Right eye exhibits no discharge. Left eye exhibits no discharge.  Neck: Normal range of motion. Neck supple.  Cardiovascular: Normal rate, regular rhythm, normal heart sounds and intact distal pulses.   No murmur heard. Pulmonary/Chest: Effort normal and breath sounds normal. No respiratory distress. He has no wheezes.  Abdominal: Soft. Bowel sounds are normal. He exhibits no distension. There is no tenderness.  abd soft and nontender  Lymphadenopathy:    He has no cervical adenopathy.  Neurological: He is alert. He exhibits normal muscle tone. Coordination normal.  Skin: Skin is warm and dry. No erythema.  Psychiatric: He has a normal mood and affect.    ED Course  Procedures (including critical care time)  DIAGNOSTIC STUDIES: Oxygen Saturation is 99% on room air, normal by my interpretation.    COORDINATION OF CARE: 10:00 PM- Will order pain medication. Will discharge patient with antibiotics and pain medication to manage symptoms. Advised patient to follow up with his  dental appointment. Return precautions given.  Discussed treatment plan with patient at bedside and patient verbalized agreement.     Labs Review Labs Reviewed - No data to display Imaging Review No results found.  EKG Interpretation   None       MDM   1. Pain due to dental caries      Isidor Holts presents with dental pain. No gross abscess.  Exam unconcerning for Ludwig's angina or spread of infection.  Will treat with penicillin and pain medicine.  Urged patient to follow-up with dentist at his already scheduled appointment.  Pt reports he is hungry and tolerates PO in the department without emesis.  Patient noted to be hypertensive in the emergency department.  No signs of hypertensive urgency.  Pt had HTN at last visit.  Likely some baseline HTN in combination with his pain today. Discussed with patient the need for close follow-up and management by their primary care physician.   It has been  determined that no acute conditions requiring further emergency intervention are present at this time. The patient/guardian have been advised of the diagnosis and plan. We have discussed signs and symptoms that warrant return to the ED, such as changes or worsening in symptoms.   Vital signs are stable at discharge.   BP 143/106  Pulse 62  Temp(Src) 98.3 F (36.8 C) (Oral)  Resp 18  Ht 6\' 1"  (1.854 m)  Wt 162 lb 6 oz (73.653 kg)  BMI 21.43 kg/m2  SpO2 99%  Patient/guardian has voiced understanding and agreed to follow-up with the PCP or specialist.    I personally performed the services described in this documentation, which was scribed in my presence. The recorded information has been reviewed and is accurate.    Dahlia ClientHannah Shermika Balthaser, PA-C 11/08/13 2235

## 2013-11-09 NOTE — ED Provider Notes (Signed)
Medical screening examination/treatment/procedure(s) were performed by non-physician practitioner and as supervising physician I was immediately available for consultation/collaboration.  EKG Interpretation   None        Vasco Chong, MD 11/09/13 0050 

## 2013-12-06 ENCOUNTER — Ambulatory Visit (INDEPENDENT_AMBULATORY_CARE_PROVIDER_SITE_OTHER): Payer: Self-pay | Admitting: *Deleted

## 2013-12-06 DIAGNOSIS — R55 Syncope and collapse: Secondary | ICD-10-CM

## 2014-01-04 ENCOUNTER — Ambulatory Visit (INDEPENDENT_AMBULATORY_CARE_PROVIDER_SITE_OTHER): Payer: Self-pay | Admitting: *Deleted

## 2014-01-04 DIAGNOSIS — R55 Syncope and collapse: Secondary | ICD-10-CM

## 2014-01-04 LAB — MDC_IDC_ENUM_SESS_TYPE_REMOTE

## 2014-01-25 ENCOUNTER — Ambulatory Visit (INDEPENDENT_AMBULATORY_CARE_PROVIDER_SITE_OTHER): Payer: Self-pay | Admitting: *Deleted

## 2014-01-25 DIAGNOSIS — R55 Syncope and collapse: Secondary | ICD-10-CM

## 2014-01-27 LAB — MDC_IDC_ENUM_SESS_TYPE_REMOTE

## 2014-02-02 ENCOUNTER — Encounter (HOSPITAL_COMMUNITY): Payer: Self-pay | Admitting: Emergency Medicine

## 2014-02-02 ENCOUNTER — Emergency Department (HOSPITAL_COMMUNITY)
Admission: EM | Admit: 2014-02-02 | Discharge: 2014-02-02 | Disposition: A | Discharge status: Home / Self Care | Payer: Self-pay | Attending: Emergency Medicine | Admitting: Emergency Medicine

## 2014-02-02 DIAGNOSIS — F17200 Nicotine dependence, unspecified, uncomplicated: Secondary | ICD-10-CM | POA: Insufficient documentation

## 2014-02-02 DIAGNOSIS — K089 Disorder of teeth and supporting structures, unspecified: Secondary | ICD-10-CM | POA: Insufficient documentation

## 2014-02-02 DIAGNOSIS — K0889 Other specified disorders of teeth and supporting structures: Secondary | ICD-10-CM

## 2014-02-02 MED ORDER — OXYCODONE-ACETAMINOPHEN 5-325 MG PO TABS: 1.0000 | ORAL_TABLET | ORAL | Status: AC | PRN

## 2014-02-02 MED ORDER — PENICILLIN V POTASSIUM 250 MG PO TABS: 500.0000 mg | ORAL_TABLET | Freq: Four times a day (QID) | ORAL | Status: AC

## 2014-02-02 NOTE — ED Notes (Signed)
The pt has had a toothache for 2-3 days

## 2014-02-02 NOTE — ED Provider Notes (Signed)
CSN: [id number]     Arrival date [id number]& time 02/02/14  0015 History   First MD Initiated Contact with Patient 02/02/14 0030     Chief Complaint  Patient presents with  . Dental Pain     (Consider location/radiation/quality/duration/timing/severity/associated sxs/prior Treatment) Patient is a 43 y.o. male presenting with tooth pain. The history is provided by the patient. No language interpreter was used.  Dental Pain Location:  Lower Lower teeth location:  18/LL 2nd molar Quality:  Aching and throbbing Severity:  Moderate Onset quality:  Gradual Associated symptoms: facial pain and facial swelling   Associated symptoms: no difficulty swallowing, no neck pain and no trismus     Past Medical History  Diagnosis Date  . Syncope 01/24/2013   Past Surgical History  Procedure Laterality Date  . Hernia repair  1982  . Pace maker     Family History  Problem Relation Age of Onset  . Hypertension Mother   . Other Mother     Diabetes  . Heart attack Cousin 39    Maternal. Sudden cardiac arrest.   . Other Father     Dialysis  . Hypertension Sister   . Other Sister     Diabetes   History  Substance Use Topics  . Smoking status: Current Some Day Smoker -- 1.00 packs/day for 10 years    Types: Cigarettes  . Smokeless tobacco: Never Used  . Alcohol Use: Yes     Comment: Occasioanl     Review of Systems  HENT: Positive for dental problem and facial swelling. Negative for trouble swallowing.   Gastrointestinal: Negative for nausea.  Musculoskeletal: Negative for neck pain.      Allergies  Review of patient's allergies indicates no known allergies.  Home Medications   Prior to Admission medications   Medication Sig Start Date End Date Taking? Authorizing Provider  acetaminophen (TYLENOL) 325 MG tablet Take 650 mg by mouth every 6 (six) hours as needed for mild pain.   Yes Historical Provider, MD   BP 149/83  Pulse 77  Temp(Src) 99 F (37.2 C)  Resp 16  Ht 6' (1.829 m)   Wt 159 lb (72.122 kg)  BMI 21.56 kg/m2  SpO2 99% Physical Exam  Constitutional: He is oriented to person, place, and time. He appears well-developed and well-nourished. No distress.  Uncomfortable appearing.  HENT:  Mouth/Throat: Oropharynx is clear and moist.  Generally good dentition with decay of left lower 2nd molar. Surrounding gingival swelling without pointing abscess.    Eyes: Conjunctivae are normal.  Pulmonary/Chest: Effort normal.  Lymphadenopathy:       Head (left side): Submental adenopathy present.  Neurological: He is alert and oriented to person, place, and time.  Skin: Skin is warm and dry.  Psychiatric: He has a normal mood and affect.    ED Course  Procedures (including critical care time) Labs Review Labs Reviewed - No data to display  Imaging Review No results found.   EKG Interpretation None      MDM   Final diagnoses:  None    1. Dental pain  Uncomplicated dental pain - abx, pain control, dental follow up.    Arnoldo HookerShari A Juanice Warburton, PA-C 02/02/14 [phone]

## 2014-02-02 NOTE — ED Provider Notes (Signed)
Medical screening examination/treatment/procedure(s) were performed by non-physician practitioner and as supervising physician I was immediately available for consultation/collaboration.   Jordie Skalsky, MD 02/02/14 0754 

## 2014-02-02 NOTE — ED Notes (Signed)
The pt has had a toothache for 2 days,.

## 2014-02-02 NOTE — Discharge Instructions (Signed)

## 2014-02-08 ENCOUNTER — Ambulatory Visit (INDEPENDENT_AMBULATORY_CARE_PROVIDER_SITE_OTHER): Payer: Self-pay | Admitting: *Deleted

## 2014-02-08 DIAGNOSIS — R55 Syncope and collapse: Secondary | ICD-10-CM

## 2014-02-08 LAB — MDC_IDC_ENUM_SESS_TYPE_REMOTE

## 2014-02-12 ENCOUNTER — Encounter: Payer: Self-pay | Admitting: Internal Medicine

## 2014-03-29 ENCOUNTER — Encounter: Payer: Self-pay | Admitting: Cardiology

## 2014-03-29 ENCOUNTER — Telehealth: Payer: Self-pay | Admitting: Cardiology

## 2014-03-29 NOTE — Telephone Encounter (Signed)
LM with roommate for pt to return call.

## 2014-04-26 ENCOUNTER — Encounter: Payer: Self-pay | Admitting: Internal Medicine

## 2014-05-01 ENCOUNTER — Telehealth: Payer: Self-pay | Admitting: Cardiology

## 2014-05-01 NOTE — Telephone Encounter (Signed)
Received returned mail

## 2014-09-19 ENCOUNTER — Encounter (HOSPITAL_COMMUNITY): Payer: Self-pay | Admitting: Internal Medicine

## 2014-10-08 ENCOUNTER — Encounter: Payer: Self-pay | Admitting: *Deleted

## 2014-11-08 ENCOUNTER — Encounter: Payer: Self-pay | Admitting: *Deleted

## 2014-11-21 ENCOUNTER — Encounter (HOSPITAL_COMMUNITY): Payer: Self-pay | Admitting: Emergency Medicine

## 2014-11-21 ENCOUNTER — Emergency Department (HOSPITAL_COMMUNITY)
Admission: EM | Admit: 2014-11-21 | Discharge: 2014-11-21 | Disposition: A | Discharge status: Home / Self Care | Payer: Self-pay | Attending: Emergency Medicine | Admitting: Emergency Medicine

## 2014-11-21 DIAGNOSIS — K047 Periapical abscess without sinus: Secondary | ICD-10-CM | POA: Insufficient documentation

## 2014-11-21 DIAGNOSIS — K029 Dental caries, unspecified: Secondary | ICD-10-CM | POA: Insufficient documentation

## 2014-11-21 DIAGNOSIS — Z720 Tobacco use: Secondary | ICD-10-CM | POA: Insufficient documentation

## 2014-11-21 DIAGNOSIS — K088 Other specified disorders of teeth and supporting structures: Secondary | ICD-10-CM | POA: Insufficient documentation

## 2014-11-21 DIAGNOSIS — K0889 Other specified disorders of teeth and supporting structures: Secondary | ICD-10-CM

## 2014-11-21 IMAGING — CR DG SHOULDER 2+V*L*
3 series · 3 of 3 positions shown · non-contrast
Comparison: None.

CLINICAL DATA: Status post fall; left shoulder pain.

LEFT SHOULDER - 2+ VIEW

[w shoulder internal left]
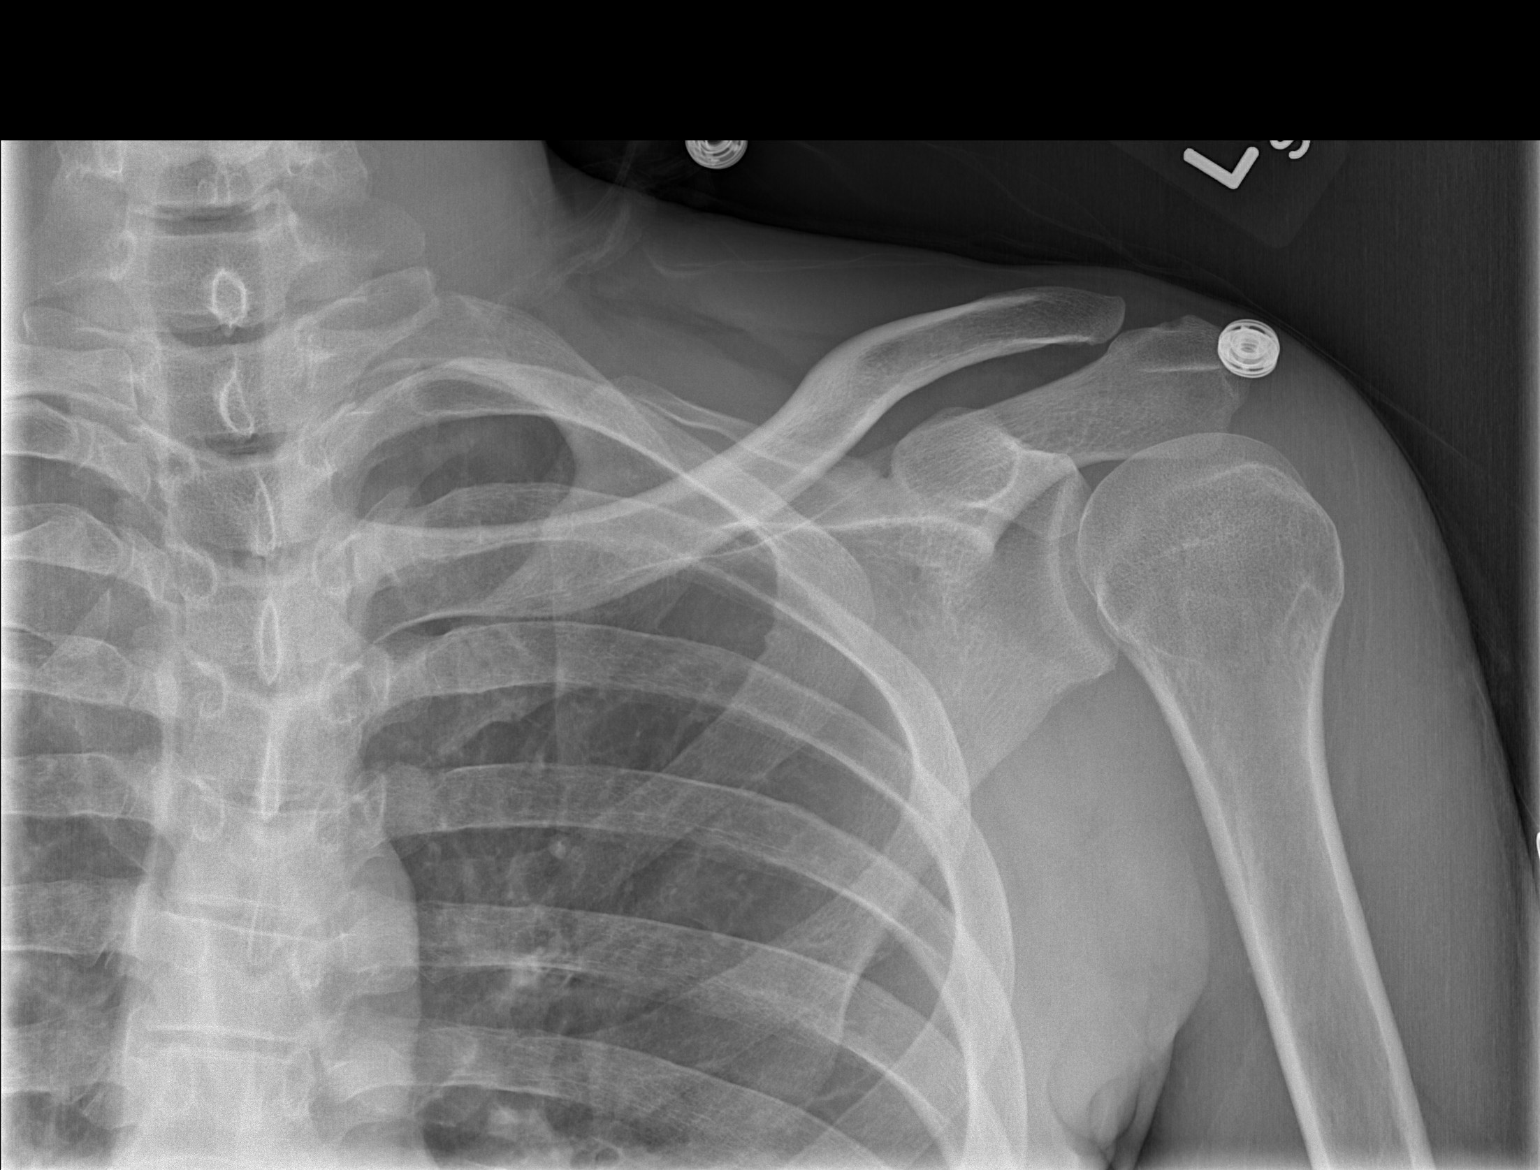

[w shoulder external left]
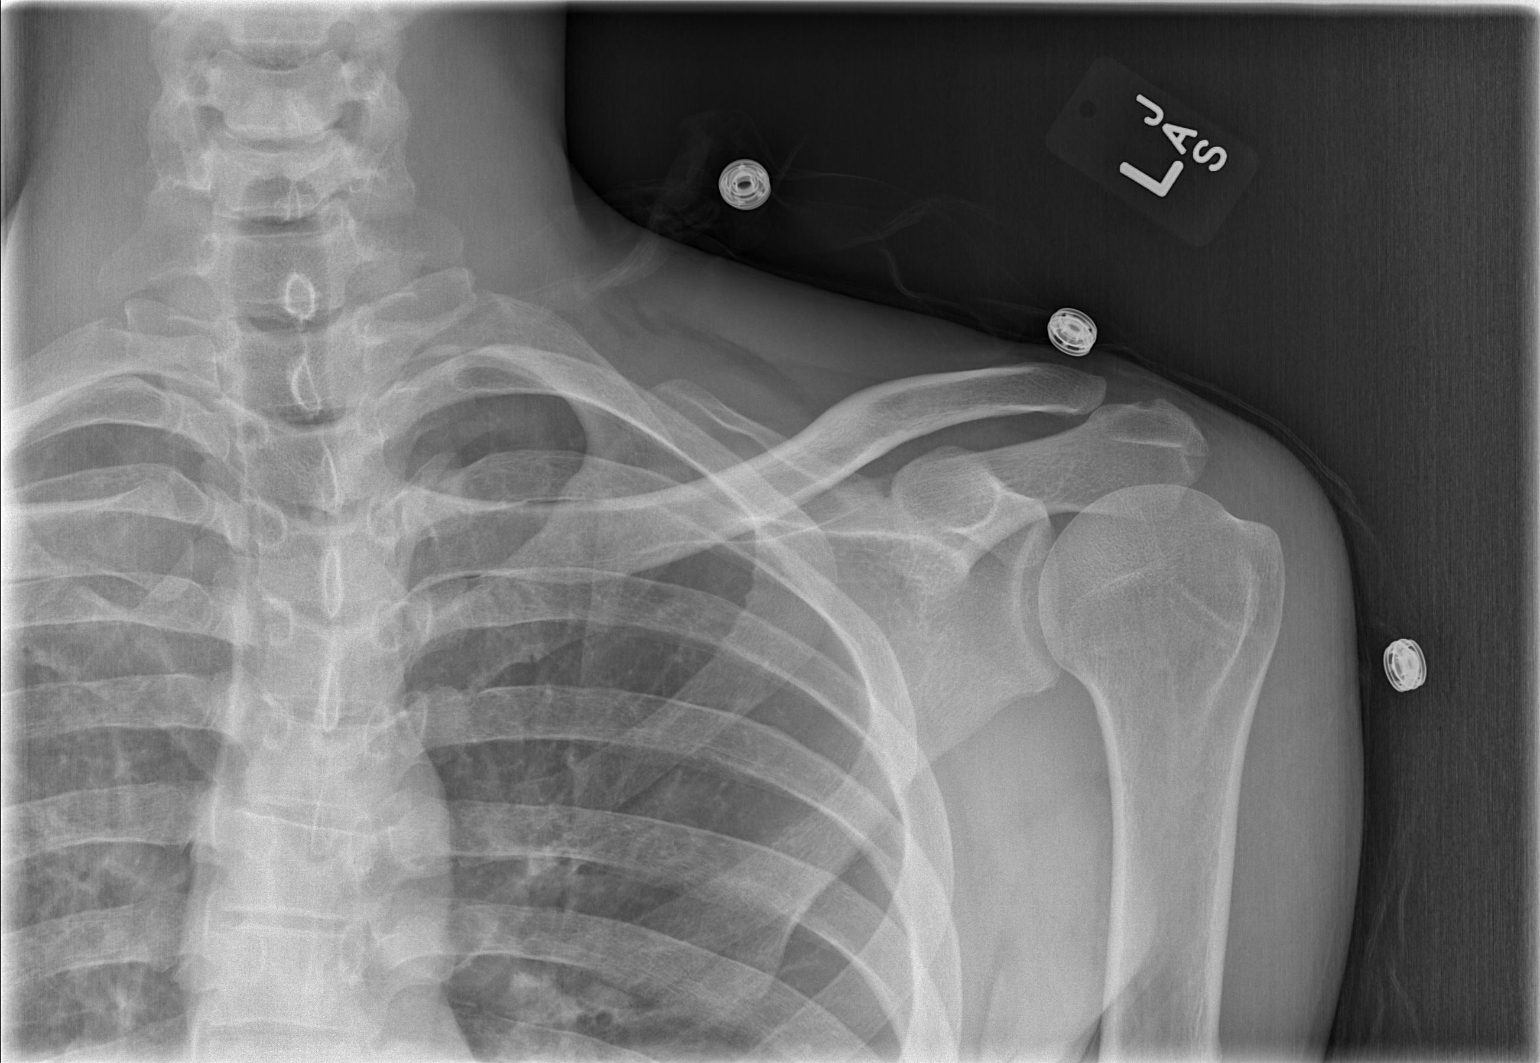

[w shoulder y-view left]
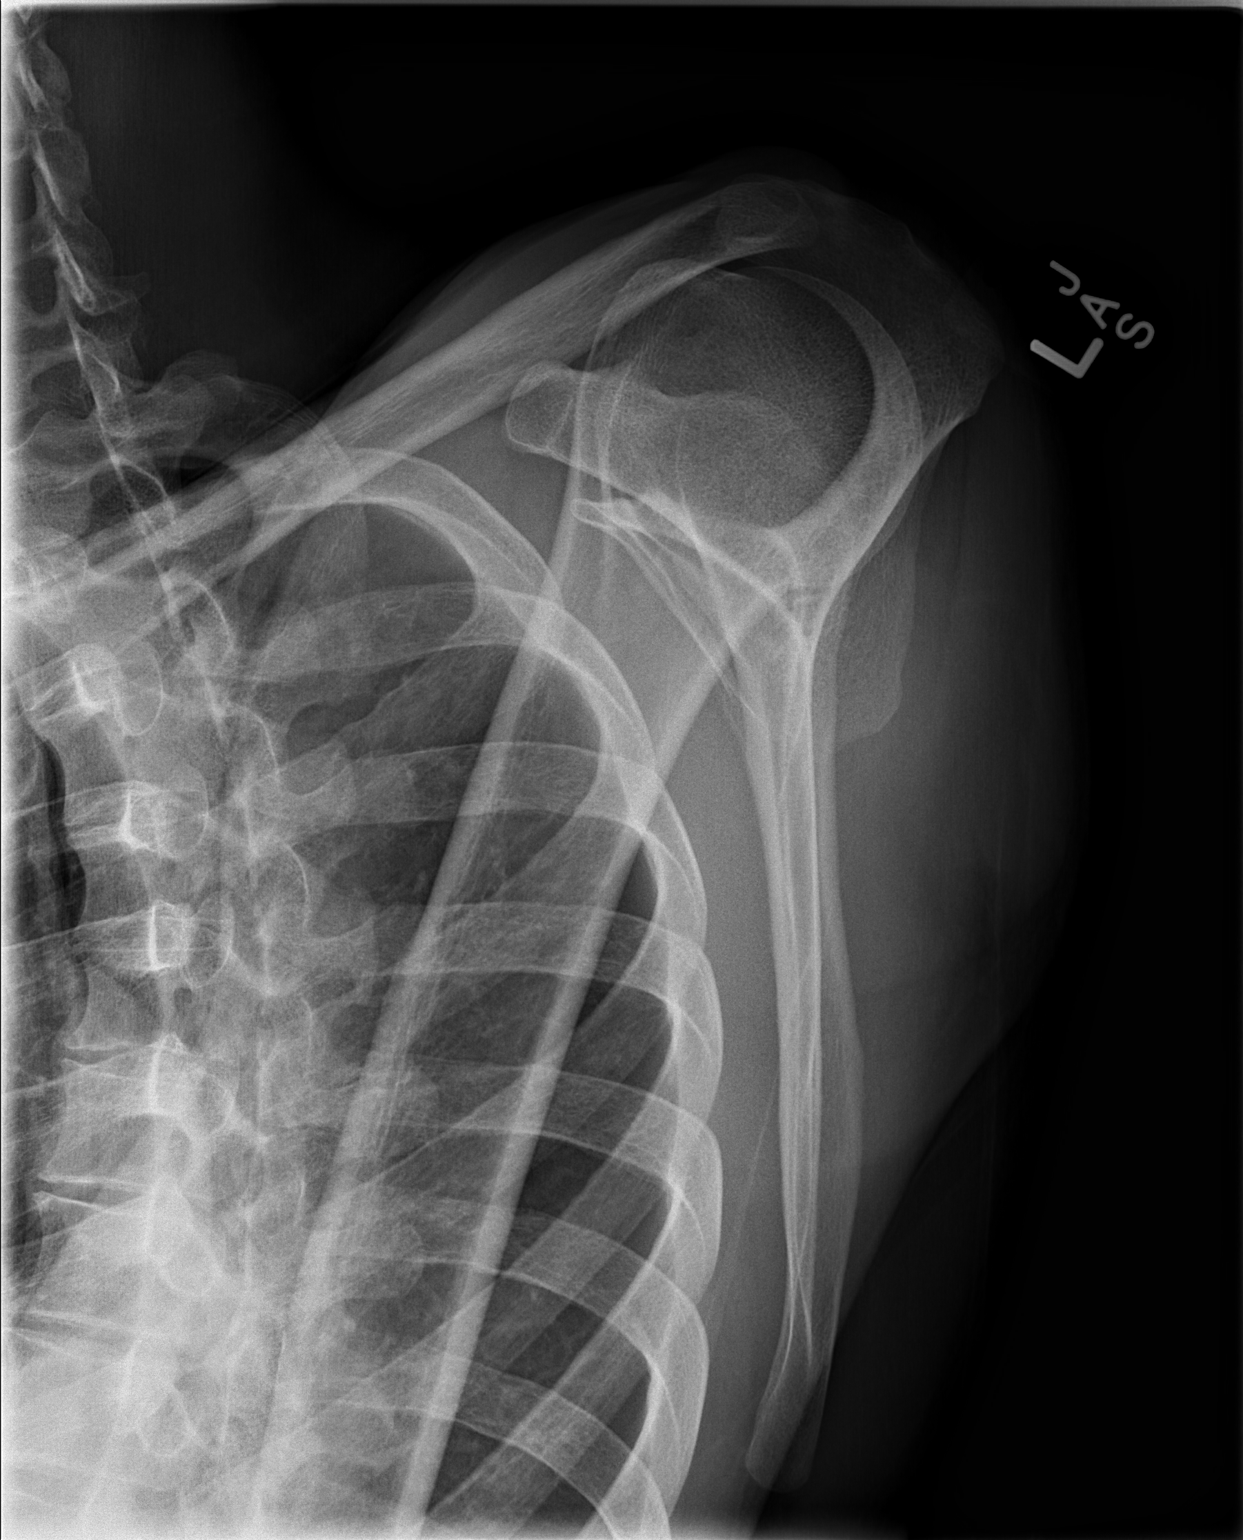

[3 of 3 positions shown; findings below may reference images not displayed]

FINDINGS: There is no evidence of fracture or dislocation.  The
left humeral head is seated within the glenoid fossa.  The
acromioclavicular joint is unremarkable in appearance.  No
significant soft tissue abnormalities are seen.  The visualized
portions of the left lung are clear.
IMPRESSION: No evidence of fracture or dislocation.

## 2014-11-21 MED ORDER — HYDROCODONE-ACETAMINOPHEN 5-325 MG PO TABS
1.0000 | ORAL_TABLET | Freq: Once | ORAL | Status: AC
  Administered 2014-11-21: 1 via ORAL
  Filled 2014-11-21: qty 1

## 2014-11-21 MED ORDER — HYDROCODONE-ACETAMINOPHEN 5-325 MG PO TABS: 1.0000 | ORAL_TABLET | Freq: Four times a day (QID) | ORAL | Status: AC | PRN

## 2014-11-21 MED ORDER — NAPROXEN 500 MG PO TABS: 500.0000 mg | ORAL_TABLET | Freq: Two times a day (BID) | ORAL | Status: AC | PRN

## 2014-11-21 MED ORDER — DOXYCYCLINE HYCLATE 100 MG PO CAPS: 100.0000 mg | ORAL_CAPSULE | Freq: Two times a day (BID) | ORAL | Status: AC

## 2014-11-21 NOTE — ED Notes (Signed)
Pt. reports intermittent left lateral throat/neck pain for several days , denies injury , respirations unlabored / airway intact .

## 2014-11-21 NOTE — ED Provider Notes (Signed)
CSN: [id number]     Arrival date & time 11/21/14  2054 History   First MD Initiated Contact with Patient 11/21/14 2158     Chief Complaint  Patient presents with  . Neck Pain     (Consider location/radiation/quality/duration/timing/severity/associated sxs/prior Treatment) HPI Comments: Justin Rangel is a 44 y.o. male with a PMHx of syncope, who presents to the ED with complaints of left lower molar #17 dental pain 3 days. He reports 9/10 constant sharp stabbing pain in the left lower molar, radiating to underneath his jaw in this area, improved somewhat with Tylenol, worsened with cold air and pressure to the tooth, and unrelieved with warmth and orajel. Patient states that underneath his jaw on the left side he has a "lump". Denies neck pain or throat pain, as the nursing note reported, states the only pain is to his tooth and the underside of his jaw. He denies any fevers, chills, facial swelling, gum swelling or drainage, bleeding gums, difficulty swallowing, trismus, drooling, ear pain or drainage, sore throat, URI symptoms, chest pain or shortness of breath, abdominal pain, nausea, vomiting, diarrhea, constipation, headache, vision changes, neck pain/stiffness, numbness, tingling, weakness, or known dental fracture or issue on this side. He has had dental caries in the past, last visited a dentist approximately 1 year ago. Smokes daily.  Patient is a 44 y.o. male presenting with tooth pain. The history is provided by the patient. No language interpreter was used.  Dental Pain Location:  Lower Lower teeth location:  17/LL 3rd molar Quality:  Sharp Severity:  Moderate (9/10) Onset quality:  Gradual Duration:  3 days Timing:  Constant Progression:  Unchanged Chronicity:  New Context: poor dentition   Relieved by:  Acetaminophen Worsened by:  Cold food/drink and pressure Ineffective treatments:  Heat and topical anesthetic gel Associated symptoms: no congestion, no difficulty  swallowing, no drooling, no facial pain, no facial swelling, no fever, no gum swelling, no headaches, no neck pain, no neck swelling, no oral bleeding, no oral lesions and no trismus   Risk factors: lack of dental care and smoking     Past Medical History  Diagnosis Date  . Syncope 01/24/2013   Past Surgical History  Procedure Laterality Date  . Hernia repair  1982  . Visual merchandiser    . Tilt table study N/A 01/26/2013    Procedure: Epinephrine challenge;  Surgeon: Hillis Range, MD;  Location: [facility] CATH LAB;  Service: Cardiovascular;  Laterality: N/A;  . Loop recorder implant N/A 01/29/2013    Procedure: LOOP RECORDER IMPLANT;  Surgeon: Duke Salvia, MD;  Location: [facility] CATH LAB;  Service: Cardiovascular;  Laterality: N/A;   Family History  Problem Relation Age of Onset  . Hypertension Mother   . Other Mother     Diabetes  . Heart attack Cousin 39    Maternal. Sudden cardiac arrest.   . Other Father     Dialysis  . Hypertension Sister   . Other Sister     Diabetes   History  Substance Use Topics  . Smoking status: Current Some Day Smoker -- 1.00 packs/day for 10 years    Types: Cigarettes  . Smokeless tobacco: Never Used  . Alcohol Use: Yes     Comment: Occasioanl     Review of Systems  Constitutional: Negative for fever and chills.  HENT: Positive for dental problem. Negative for congestion, drooling, ear discharge, ear pain, facial swelling, mouth sores, rhinorrhea, sore throat and trouble swallowing.   Eyes: Negative for  pain and visual disturbance.  Respiratory: Negative for shortness of breath.   Cardiovascular: Negative for chest pain.  Gastrointestinal: Negative for nausea, vomiting, abdominal pain, diarrhea and constipation.  Musculoskeletal: Negative for myalgias, arthralgias, neck pain and neck stiffness.  Skin: Negative for rash.  Allergic/Immunologic: Negative for immunocompromised state.  Neurological: Negative for facial asymmetry, weakness, numbness and  headaches.   10 Systems reviewed and are negative for acute change except as noted in the HPI.    Allergies  Review of patient's allergies indicates no known allergies.  Home Medications   Prior to Admission medications   Medication Sig Start Date End Date Taking? Authorizing Provider  acetaminophen (TYLENOL) 325 MG tablet Take 650 mg by mouth every 6 (six) hours as needed for mild pain.    Historical Provider, MD  oxyCODONE-acetaminophen (PERCOCET/ROXICET) 5-325 MG per tablet Take 1-2 tablets by mouth every 4 (four) hours as needed for severe pain. 02/02/14   Shari A Upstill, PA-C   BP 140/89 mmHg  Pulse 61  Temp(Src) 98.3 F (36.8 C) (Oral)  Resp 14  Ht 6\' 1"  (1.854 m)  Wt 155 lb (70.308 kg)  BMI 20.45 kg/m2  SpO2 97% Physical Exam  Constitutional: He is oriented to person, place, and time. Vital signs are normal. He appears well-developed and well-nourished.  Non-toxic appearance. No distress.  Afebrile nontoxic NAD  HENT:  Head: Normocephalic and atraumatic.  Right Ear: Hearing, tympanic membrane, external ear and ear canal normal.  Left Ear: Hearing, tympanic membrane, external ear and ear canal normal.  Nose: Nose normal.  Mouth/Throat: Uvula is midline, oropharynx is clear and moist and mucous membranes are normal. No oral lesions. No trismus in the jaw. Abnormal dentition. Dental caries present. No dental abscesses or uvula swelling.    LL molar #17 TTP with mild gingival erythema surrounding this area, gingival hypertrophy from posterior to the tooth growing over the last molar. Diffuse dental decay, multiple caries. No dental fracture. No obvious abscess. Oropharynx clear and moist, no tonsillar or uvular swelling, no exudates.   Eyes: Conjunctivae and EOM are normal. Right eye exhibits no discharge. Left eye exhibits no discharge.  Neck: Normal range of motion. Neck supple.  FROM intact without spinous process or paraspinous muscle TTP, no bony stepoffs or deformities,  no muscle spasms. No rigidity or meningeal signs. No bruising or swelling.   Cardiovascular: Normal rate.   Pulmonary/Chest: Effort normal. No respiratory distress.  Abdominal: Normal appearance. He exhibits no distension.  Musculoskeletal: Normal range of motion.  Lymphadenopathy:       Head (left side): Submandibular adenopathy present.  Reactive submandibular LAD of L side at angle of jaw, mildly TTP, mobile and <1cm  Neurological: He is alert and oriented to person, place, and time. He has normal strength. No sensory deficit.  CN 5 and 7 grossly intact  Skin: Skin is warm, dry and intact. No rash noted.  Psychiatric: He has a normal mood and affect.  Nursing note and vitals reviewed.   ED Course  Procedures (including critical care time) Labs Review Labs Reviewed - No data to display  Imaging Review No results found.   EKG Interpretation None      MDM   Final diagnoses:  Pain, dental  Dental decay  Dental abscess    44 y.o. male here with Dental pain associated with gingival irritation and possible dental abscess with patient afebrile, non toxic appearing and swallowing secretions well. Has some reactive LAD to this side of his jaw,  raising concern for infectious etiology. I gave patient referral to dentist and stressed the importance of dental follow up for ultimate management of dental pain.  I have also discussed reasons to return immediately to the ER.  Patient expresses understanding and agrees with plan.  I will also give doxycycline and pain control.  Pain meds given here. Declined dental block. Urged patient to stop smoking.   BP 140/89 mmHg  Pulse 61  Temp(Src) 98.3 F (36.8 C) (Oral)  Resp 14  Ht  (1.854 m)  Wt 155 lb (70.308 kg)  BMI 20.45 kg/m2  SpO2 97%  Meds ordered this encounter  Medications  . HYDROcodone-acetaminophen (NORCO/VICODIN) 5-325 MG per tablet 1 tablet    Sig:   . naproxen (NAPROSYN) 500 MG tablet    Sig: Take 1 tablet (500 mg  total) by mouth 2 (two) times daily as needed for mild pain, moderate pain or headache (TAKE WITH MEALS.).    Dispense:  20 tablet    Refill:  0    Order Specific Question:  Supervising Provider    Answer:  Eber Hong D [[id number]]  . HYDROcodone-acetaminophen (NORCO) 5-325 MG per tablet    Sig: Take 1 tablet by mouth every 6 (six) hours as needed for severe pain.    Dispense:  6 tablet    Refill:  0    Order Specific Question:  Supervising Provider    Answer:  Eber Hong D [[id number]]  . doxycycline (VIBRAMYCIN) 100 MG capsule    Sig: Take 1 capsule (100 mg total) by mouth 2 (two) times daily. One po bid x 7 days    Dispense:  14 capsule    Refill:  0    Order Specific Question:  Supervising Provider    Answer:  Vida Roller [address] [place], PA-C 11/21/14 2239  Linwood Dibbles, MD 11/22/14 [phone]

## 2014-11-21 NOTE — Discharge Instructions (Signed)
Apply warm compresses to jaw throughout the day. Take antibiotic until finished and avoid direct sunlight while taking this antibiotic. Take naprosyn and norco as directed, as needed for pain but do not drive or operate machinery with pain medication use. Followup with a dentist is very important for ongoing evaluation and management of recurrent dental pain. Use the list below to find a dentist. STOP SMOKING! Return to emergency department for emergent changing or worsening symptoms.    Dental Pain Toothache is pain in or around a tooth. It may get worse with chewing or with cold or heat.  HOME CARE  Your dentist may use a numbing medicine during treatment. If so, you may need to avoid eating until the medicine wears off. Ask your dentist about this.  Only take medicine as told by your dentist or doctor.  Avoid chewing food near the painful tooth until after all treatment is done. Ask your dentist about this. GET HELP RIGHT AWAY IF:   The problem gets worse or new problems appear.  You have a fever.  There is redness and puffiness (swelling) of the face, jaw, or neck.  You cannot open your mouth.  There is pain in the jaw.  There is very bad pain that is not helped by medicine. MAKE SURE YOU:   Understand these instructions.  Will watch your condition.  Will get help right away if you are not doing well or get worse. Document Released: 03/15/2008 Document Revised: 12/20/2011 Document Reviewed: 03/15/2008 [facility] Patient Information 2015 [place], [place]. This information is not intended to replace advice given to you by your health care provider. Make sure you discuss any questions you have with your health care provider.  Dental Abscess A dental abscess is a collection of infected fluid (pus) from a bacterial infection in the inner part of the tooth (pulp). It usually occurs at the end of the tooth's root.  CAUSES   Severe tooth decay.  Trauma to the tooth that allows  bacteria to enter into the pulp, such as a broken or chipped tooth. SYMPTOMS   Severe pain in and around the infected tooth.  Swelling and redness around the abscessed tooth or in the mouth or face.  Tenderness.  Pus drainage.  Bad breath.  Bitter taste in the mouth.  Difficulty swallowing.  Difficulty opening the mouth.  Nausea.  Vomiting.  Chills.  Swollen neck glands. DIAGNOSIS   A medical and dental history will be taken.  An examination will be performed by tapping on the abscessed tooth.  X-rays may be taken of the tooth to identify the abscess. TREATMENT The goal of treatment is to eliminate the infection. You may be prescribed antibiotic medicine to stop the infection from spreading. A root canal may be performed to save the tooth. If the tooth cannot be saved, it may be pulled (extracted) and the abscess may be drained.  HOME CARE INSTRUCTIONS  Only take over-the-counter or prescription medicines for pain, fever, or discomfort as directed by your caregiver.  Rinse your mouth (gargle) often with salt water ( tsp salt in 8 oz [250 ml] of warm water) to relieve pain or swelling.  Do not drive after taking pain medicine (narcotics).  Do not apply heat to the outside of your face.  Return to your dentist for further treatment as directed. SEEK MEDICAL CARE IF:  Your pain is not helped by medicine.  Your pain is getting worse instead of better. SEEK IMMEDIATE MEDICAL CARE IF:  You  have a fever or persistent symptoms for more than 2-3 days.  You have a fever and your symptoms suddenly get worse.  You have chills or a very bad headache.  You have problems breathing or swallowing.  You have trouble opening your mouth.  You have swelling in the neck or around the eye. Document Released: 09/27/2005 Document Revised: 06/21/2012 Document Reviewed: 01/05/2011 [facility] Patient Information 2015 [place], [place]. This information is not intended to replace  advice given to you by your health care provider. Make sure you discuss any questions you have with your health care provider. Emergency Department Resource Guide 1) Find a Doctor and Pay Out of Pocket Although you won't have to find out who is covered by your insurance plan, it is a good idea to ask around and get recommendations. You will then need to call the office and see if the doctor you have chosen will accept you as a new patient and what types of options they offer for patients who are self-pay. Some doctors offer discounts or will set up payment plans for their patients who do not have insurance, but you will need to ask so you aren't surprised when you get to your appointment.  2) Contact Your Local Health Department Not all health departments have doctors that can see patients for sick visits, but many do, so it is worth a call to see if yours does. If you don't know where your local health department is, you can check in your phone book. The CDC also has a tool to help you locate your state's health department, and many state websites also have listings of all of their local health departments.  3) Find a Walk-in Clinic If your illness is not likely to be very severe or complicated, you may want to try a walk in clinic. These are popping up all over the country in pharmacies, drugstores, and shopping centers. They're usually staffed by nurse practitioners or physician assistants that have been trained to treat common illnesses and complaints. They're usually fairly quick and inexpensive. However, if you have serious medical issues or chronic medical problems, these are probably not your best option.  No Primary Care Doctor: - Call Health Connect at  [phone] - they can help you locate a primary care doctor that  accepts your insurance, provides certain services, etc. - Physician Referral Service- [phone]  Chronic Pain Problems: Organization         Address  Phone   Notes  [facility]  [phone] Patients need to be referred by their primary care doctor.   Medication Assistance: Organization         Address  Phone   Notes  [facility] Medication [facility] [address] [place] [place], [place] [address] [phone] --Must be a resident of [facility] -- Must have NO insurance coverage whatsoever (no Medicaid/ Medicare, etc.) -- The pt. MUST have a primary care doctor that directs their care regularly and follows them in the community   [facility]  [phone]   [place]  [phone]     Dental Care: Organization         Address  Phone  Notes  [facility] Department of [facility] [address] [place], [place] [phone] Accepts children up to age 81 who are enrolled in [place] or [place] Health Choice; pregnant women with a Medicaid card; and children who have applied for Medicaid or [place]  Health Choice, but were declined, whose parents can pay a reduced fee at time of service.  [facility] [place]  [address], [place] [phone] Accepts children up to age 42 who are enrolled in [place] or [place] Health Choice; pregnant women with a Medicaid card; and children who have applied for Medicaid or [place] Health Choice, but were declined, whose parents can pay a reduced fee at time of service.  [facility]  [address], [place] [phone] Patients are seen by appointment only. Walk-ins are not accepted. [facility] will see patients 42 years of age and older. Monday - Tuesday (8am-5pm) Most Wednesdays (8:30-5pm) $30 per visit, cash only  [facility]  [address], [facility] [place] [phone] Patients are seen by appointment only. Walk-ins are not accepted. [facility] will see patients 42 years of age and older. One Wednesday Evening (Monthly: Volunteer Based).  $30 per  visit, cash only  [facility]  [phone] for adults; Children under age 504, call Graduate Pediatric Dentistry at [phone]. Children aged 674-14, please call [phone] to request a pediatric application.  Dental services are provided in all areas of dental care including fillings, crowns and bridges, complete and partial dentures, implants, gum treatment, root canals, and extractions. Preventive care is also provided. Treatment is provided to both adults and children. Patients are selected via a lottery and there is often a waiting list.   [facility] [address], [place]  [phone] [email or web address]   [facility] [address], [place], [place] [phone], Ext. [phone] Second and Fourth Thursday of each month, opens at 6:30 AM; Clinic ends at 9 AM.  Patients are seen on a first-come first-served basis, and a limited number are seen during each clinic.   [facility]  [address], [place], [place] [phone]   Eligibility Requirements You must have lived in [place], [place], or [place] for at least the last three months.   You cannot be eligible for state or federal sponsored [place] insurance, including [employer], [place], or [employer]icare.   You generally cannot be eligible for healthcare insurance through your employer.    How to apply: Eligibility screenings are held every Tuesday and Wednesday afternoon from 1:00 pm until 4:00 pm. You do not need an appointment for the interview!  [facility] [address], [place], [place] [phone]   [facility]  [phone]   [facility]  [phone]   [facility]  [phone]

## 2014-11-29 ENCOUNTER — Encounter: Payer: Self-pay | Admitting: Cardiology

## 2014-11-29 ENCOUNTER — Telehealth: Payer: Self-pay | Admitting: Cardiology

## 2014-11-29 NOTE — Telephone Encounter (Signed)
Wrong number. Disconnected monitor letter mailed.

## 2015-01-03 ENCOUNTER — Encounter: Payer: Self-pay | Admitting: *Deleted

## 2015-01-09 ENCOUNTER — Encounter: Payer: Self-pay | Admitting: *Deleted

## 2015-01-30 ENCOUNTER — Encounter: Payer: Self-pay | Admitting: Cardiology
